# Patient Record
Sex: Female | Born: 1995 | Race: Black or African American | Hispanic: No | Marital: Single | Smoking: Current every day smoker
Health system: Southern US, Community
[De-identification: ages and names within clinical notes are randomized; demographics above are authoritative.]

## PROBLEM LIST (undated history)

## (undated) ENCOUNTER — Inpatient Hospital Stay (HOSPITAL_COMMUNITY): Payer: Self-pay

## (undated) DIAGNOSIS — W3400XA Accidental discharge from unspecified firearms or gun, initial encounter: Secondary | ICD-10-CM

## (undated) DIAGNOSIS — R42 Dizziness and giddiness: Secondary | ICD-10-CM

## (undated) DIAGNOSIS — J45909 Unspecified asthma, uncomplicated: Secondary | ICD-10-CM

## (undated) DIAGNOSIS — Y249XXA Unspecified firearm discharge, undetermined intent, initial encounter: Secondary | ICD-10-CM

## (undated) DIAGNOSIS — E669 Obesity, unspecified: Secondary | ICD-10-CM

## (undated) HISTORY — PX: TYMPANOSTOMY TUBE PLACEMENT: SHX32

---
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
## 2016-04-08 IMAGING — US US MFM FETAL NUCHAL TRANSLUCENCY
1 series · 13 of 23 positions shown · non-contrast
Comparison: none

[Series 1: us mfm fetal nuchal translucency · 0.26mm/px · 13 of 23 slices shown]
[im 1/23]
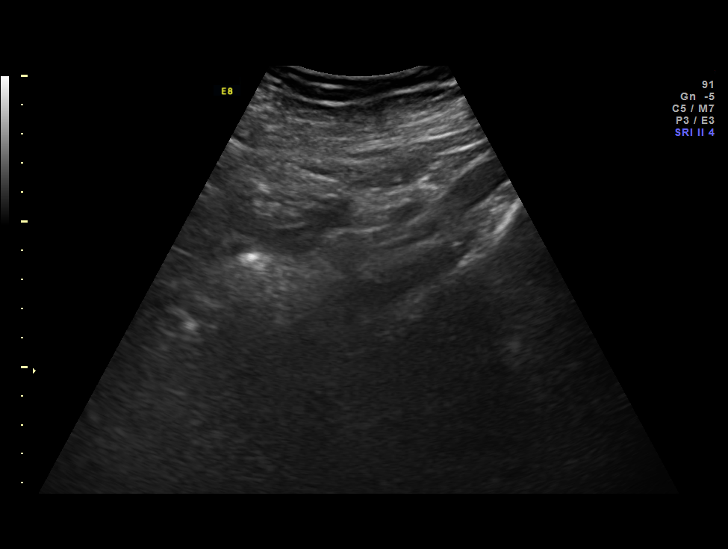
[im 3/23]
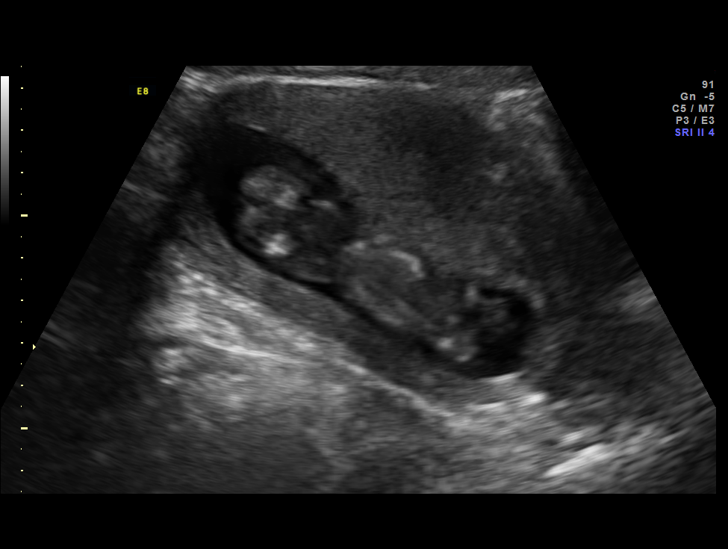
[im 5/23]
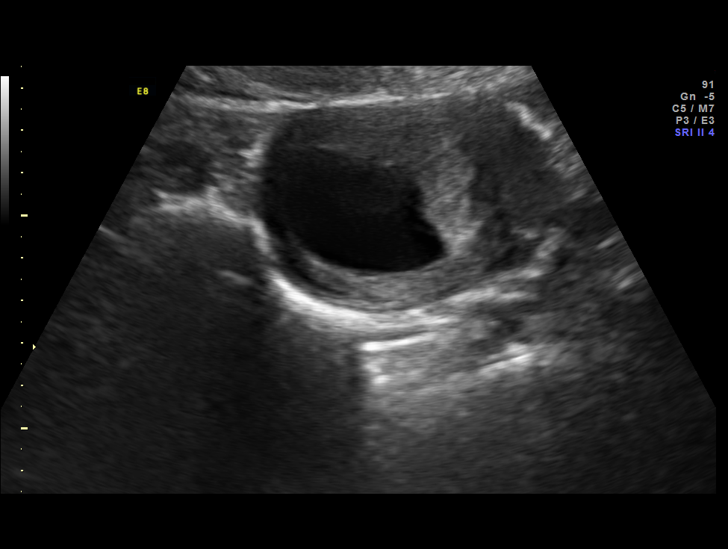
[im 7/23]
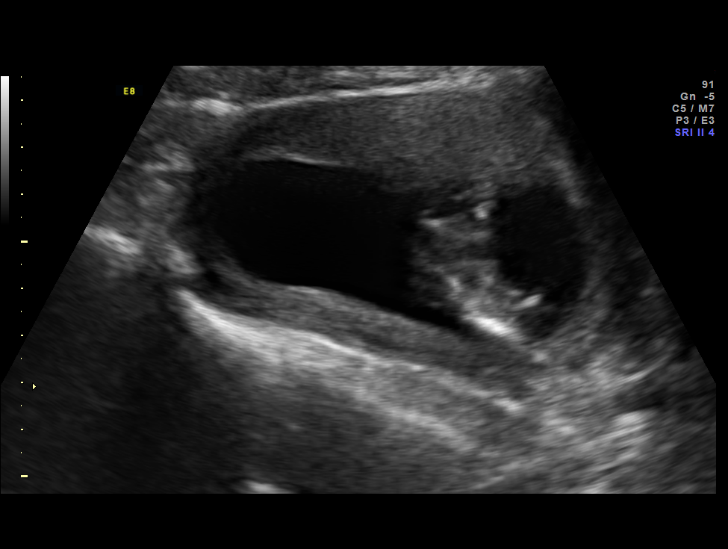
[im 8/23]
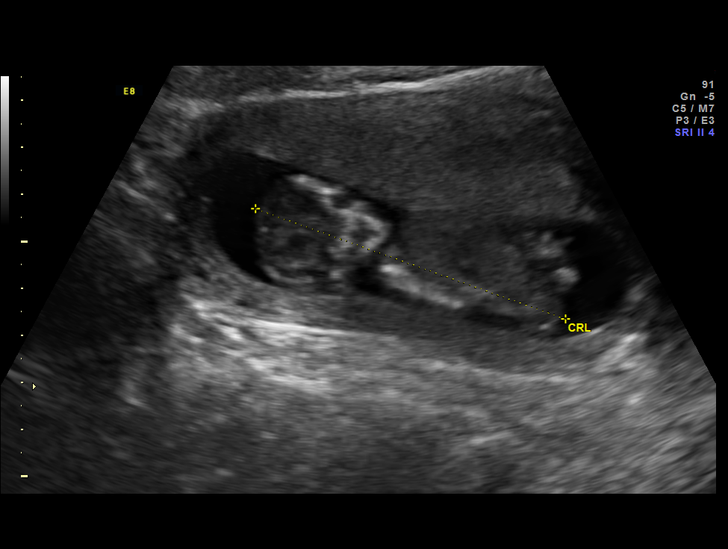
[im 10/23]
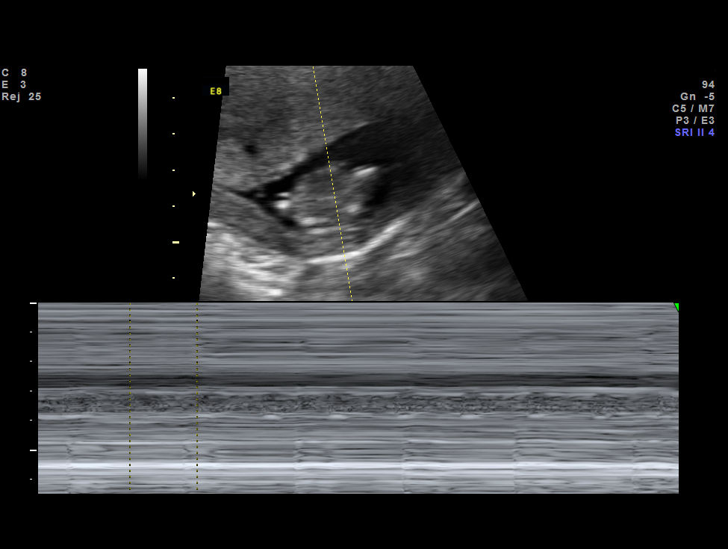
[im 12/23]
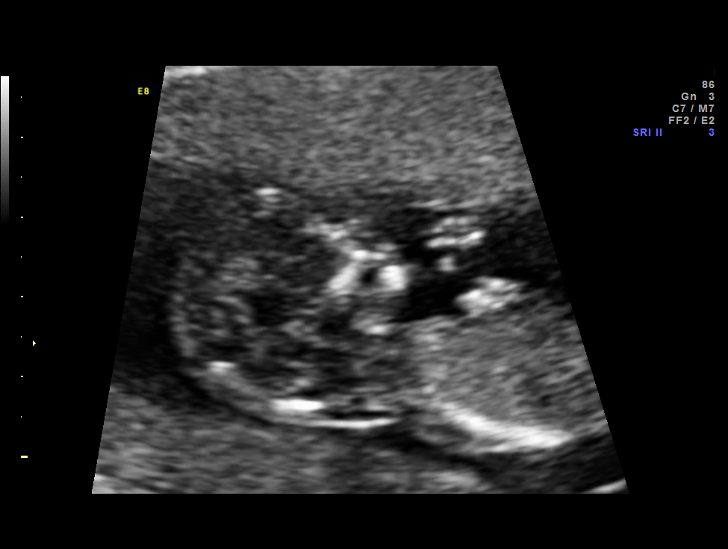
[im 14/23]
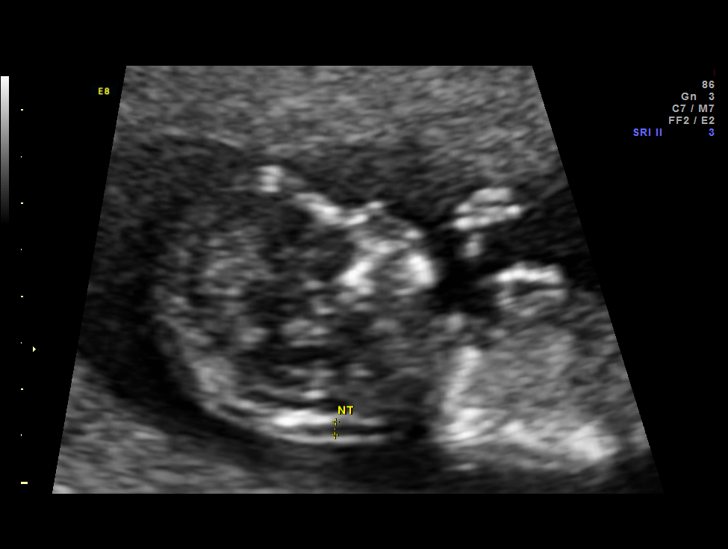
[im 16/23]
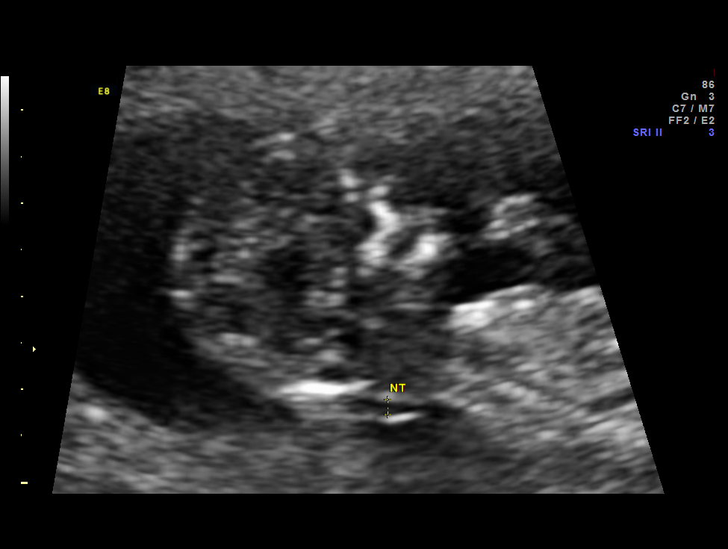
[im 17/23]
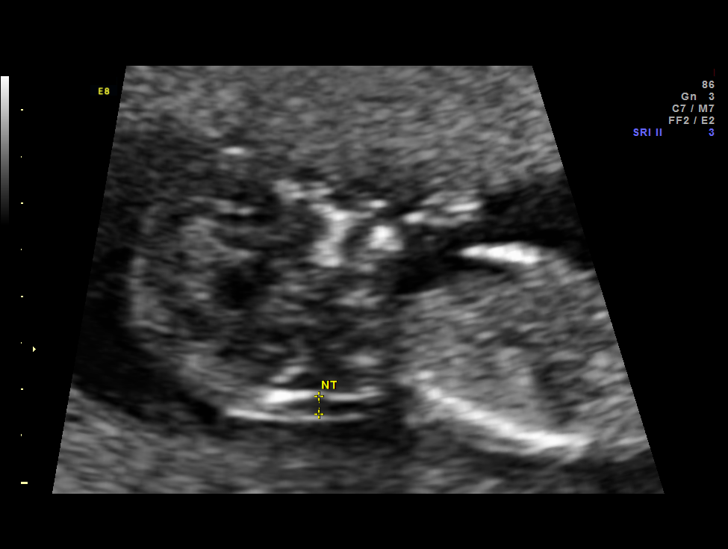
[im 19/23]
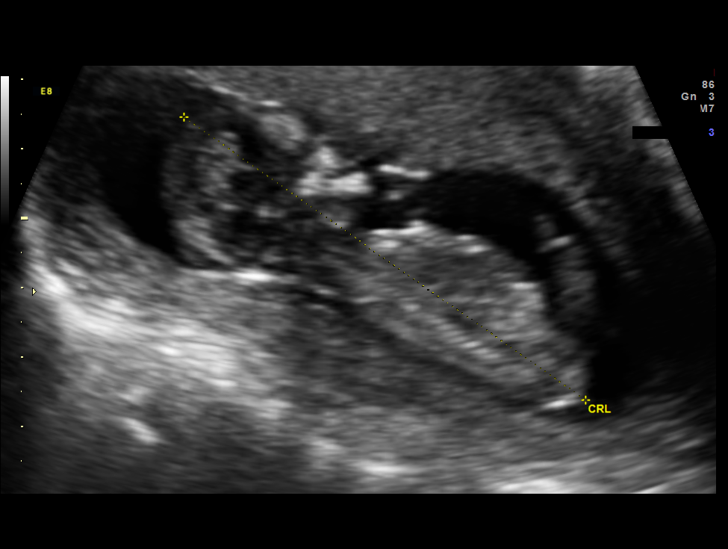
[im 21/23]
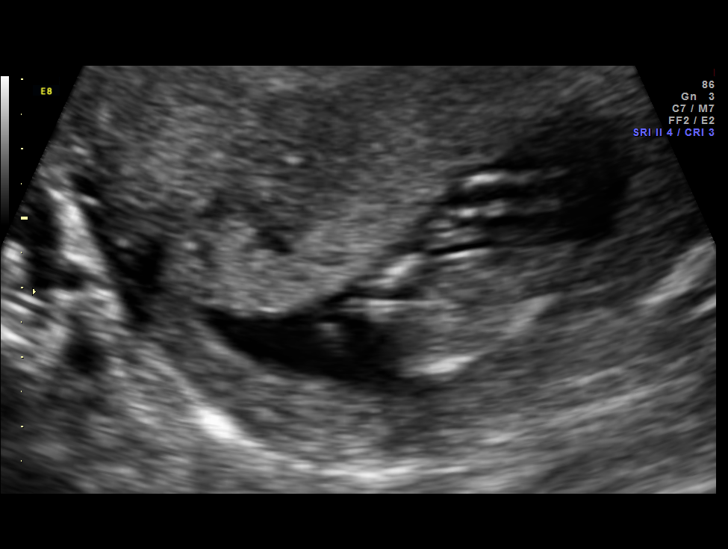
[im 23/23]
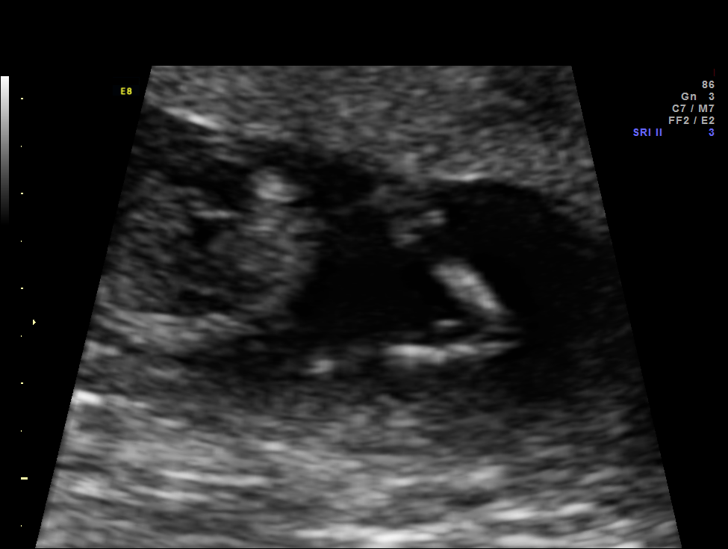

[13 of 23 positions shown; findings below may reference images not displayed]

OBSTETRICS REPORT
                      (Signed Final 04/28/2014 [DATE])

Service(s) Provided

Indications

 First trimester aneuploidy screen (NT)                Z36
 13 weeks gestation of pregnancy
Fetal Evaluation

 Num Of Fetuses:    1
 Fetal Heart Rate:  150                          bpm
 Cardiac Activity:  Observed
 Presentation:      Breech
 Placenta:          Anterior, above cervical os

 Amniotic Fluid
 AFI FV:      Subjectively within normal limits
Gestational Age

 LMP:           13w 0d        Date:  01/27/14                 EDD:   11/03/14
 Best:          13w 0d     Det. By:  LMP  (01/27/14)          EDD:   11/03/14
1st Trimester Genetic Sonogram Screening

 CRL:            70.8  mm    G. Age:   13w 1d                 EDD:   11/02/14
 Nuc Trans:       2.1  mm
 Nasal Bone:                 Present
Cervix Uterus Adnexa

 Cervix:       Normal appearance by transabdominal scan.
 Left Ovary:    Not visualized. No adnexal mass visualized.
 Right Ovary:   Not visualized. No adnexal mass visualized.
Impression

 Single IUP at 13w 0d
 NT 2.1 mm.  Nasal bone present.
 First trimester aneuploidy screen performed as noted above.
Recommendations

 Please do not draw triple/quad screen, though patient should
 be offered MSAFP for neural tube defect screening.
 Recommend ultrasound for fetal anatomy at 18-20 weeks
 gestation

 questions or concerns.

## 2018-09-24 ENCOUNTER — Other Ambulatory Visit: Payer: Self-pay

## 2018-09-24 ENCOUNTER — Encounter (HOSPITAL_COMMUNITY): Payer: Self-pay | Admitting: Emergency Medicine

## 2018-09-24 ENCOUNTER — Ambulatory Visit (HOSPITAL_COMMUNITY)
Admission: EM | Admit: 2018-09-24 | Discharge: 2018-09-24 | Disposition: A | Discharge status: Home / Self Care | Payer: Medicaid Other | Attending: Emergency Medicine | Admitting: Emergency Medicine

## 2018-09-24 DIAGNOSIS — Z113 Encounter for screening for infections with a predominantly sexual mode of transmission: Secondary | ICD-10-CM | POA: Diagnosis not present

## 2018-09-24 DIAGNOSIS — Z7251 High risk heterosexual behavior: Secondary | ICD-10-CM | POA: Diagnosis present

## 2018-09-24 DIAGNOSIS — F418 Other specified anxiety disorders: Secondary | ICD-10-CM

## 2018-09-24 DIAGNOSIS — Z3202 Encounter for pregnancy test, result negative: Secondary | ICD-10-CM | POA: Diagnosis not present

## 2018-09-24 LAB — POCT PREGNANCY, URINE: Preg Test, Ur: NEGATIVE

## 2018-09-24 NOTE — Discharge Instructions (Addendum)
We will call you with the results of your testing if positive, and treat appropriately. Return if you have known exposure, develop abdominal/pelvic/vaginal pain, vaginal discharge, anogenital lesions or fever.

## 2018-09-24 NOTE — ED Triage Notes (Signed)
Pt states "I used a rag that was used by someone else to clean my whole body and I just want to be checked to make sure I dont have anything". Pt requesting std check. Denies symptoms.

## 2018-09-24 NOTE — ED Provider Notes (Signed)
[facility]    CSN: [id number] Arrival date & time: 09/24/18  1202     History   Chief Complaint Chief Complaint  Patient presents with  . SEXUALLY TRANSMITTED DISEASE    HPI Carol Gonzales is a 23 y.o. female presenting for acute concern of STD check.  Patient states that she used a rag of a female friend while away the other weekend.  States that she heard rumors that this person has HSV.  Patient denies knowing if friend had active flare.  Patient denies vaginal irritation, pain, anogenital lesions.  Patient currently sexually active with both men and women, not routinely using condoms.  Patient has a Nexplanon in place, though is overdue for removal: Was placed August 2016.  Patient does not use backup methods.  LMP: last week.  Patient denies urinary frequency, urgency, hematuria, vaginal discharge, pelvic or vaginal pain, abdominal pain. Patient denies history of STD.  Past Medical History:  Diagnosis Date  . Asthma   . Obesity     Patient Active Problem List   Diagnosis Date Noted  . PROM (premature rupture of membranes) 11/03/2014  . Braxton Hick's contraction 09/19/2014  . Vaginal discharge during pregnancy in third trimester 09/19/2014    Past Surgical History:  Procedure Laterality Date  . TYMPANOSTOMY TUBE PLACEMENT      OB History    Gravida  1   Para  1   Term  1   Preterm      AB      Living  1     SAB      TAB      Ectopic      Multiple  0   Live Births  1            Home Medications    Prior to Admission medications   Medication Sig Start Date End Date Taking? Authorizing Provider  albuterol (PROVENTIL HFA;VENTOLIN HFA) 108 (90 BASE) MCG/ACT inhaler Inhale 2 puffs into the lungs every 6 (six) hours as needed for wheezing or shortness of breath (asthma).     [provider]  calcium carbonate (TUMS - DOSED IN MG ELEMENTAL CALCIUM) 500 MG chewable tablet Chew 1 tablet by mouth daily as needed for indigestion  or heartburn.    [provider]  docusate sodium (COLACE) 100 MG capsule Take 1 capsule (100 mg total) by mouth 2 (two) times daily as needed for mild constipation. 11/06/14   Katheren Shams, DO  ibuprofen (ADVIL,MOTRIN) 600 MG tablet Take 1 tablet (600 mg total) by mouth every 6 (six) hours. 11/06/14   Katheren Shams, DO  Prenatal Vit-Fe Fumarate-FA (PRENATAL MULTIVITAMIN) TABS tablet Take 1 tablet by mouth daily at 12 noon. Patient not taking: Reported on 09/24/2018 11/06/14   Katheren Shams, DO    Family History Family History  Problem Relation Age of Onset  . Hypertension Maternal Grandmother     Social History Social History   Tobacco Use  . Smoking status: Never Smoker  Substance Use Topics  . Alcohol use: No  . Drug use: No     Allergies   Patient has no known allergies.   Review of Systems As per HPI   Physical Exam Triage Vital Signs ED Triage Vitals [09/24/18 1232]  Enc Vitals Group     BP 122/68     Pulse Rate 96     Resp 16     Temp 98.8 F (37.1 C)  Temp src      SpO2 97 %     Weight      Height      Head Circumference      Peak Flow      Pain Score 0     Pain Loc      Pain Edu?      Excl. in GC?    No data found.  Updated Vital Signs BP 122/68   Pulse 96   Temp 98.8 F (37.1 C)   Resp 16   LMP 09/20/2018   SpO2 97%   Visual Acuity Right Eye Distance:   Left Eye Distance:   Bilateral Distance:    Right Eye Near:   Left Eye Near:    Bilateral Near:     Physical Exam Constitutional:      General: She is not in acute distress.    Appearance: She is obese.  HENT:     Head: Normocephalic and atraumatic.  Eyes:     General: No scleral icterus.    Pupils: Pupils are equal, round, and reactive to light.  Cardiovascular:     Rate and Rhythm: Normal rate.  Pulmonary:     Effort: Pulmonary effort is normal.  Abdominal:     General: Bowel sounds are normal.     Palpations: Abdomen is soft.     Tenderness: There is no  abdominal tenderness. There is no right CVA tenderness, left CVA tenderness or guarding.  Genitourinary:    Comments: Patient declined, self-swab performed Skin:    Coloration: Skin is not jaundiced or pale.     Comments: Nexplanon implant palpated in left bicipital.  Neurological:     Mental Status: She is alert and oriented to person, place, and time.      UC Treatments / Results  Labs (all labs ordered are listed, but only abnormal results are displayed) Labs Reviewed  HIV ANTIBODY (ROUTINE TESTING W REFLEX)  RPR  POC URINE PREG, ED  POCT PREGNANCY, URINE  CERVICOVAGINAL ANCILLARY ONLY    EKG   Radiology No results found.  Procedures Procedures (including critical care time)  Medications Ordered in UC Medications - No data to display  Initial Impression / Assessment and Plan / UC Course  I have reviewed the triage vital signs and the nursing notes.  Pertinent labs & imaging results that were available during my care of the patient were reviewed by me and considered in my medical decision making (see chart for details).     23 year old female presenting for STD check.  No previous infection, patient is asymptomatic, though does have risk due to multiple partners, unprotected intercourse, no contraception.  Urine pregnancy negative.  Patient follow-up with PCP for routine care and contraception management.  We will contact patient if STD comes back positive and treat appropriately.  Return precautions discussed, patient verbalized understanding and is agreeable to plan. Final Clinical Impressions(s) / UC Diagnoses   Final diagnoses:  Unprotected sex  Anxiety about health     Discharge Instructions     We will call you with the results of your testing if positive, and treat appropriately. Return if you have known exposure, develop abdominal/pelvic/vaginal pain, vaginal discharge, anogenital lesions or fever.    ED Prescriptions    None     Controlled  Substance Prescriptions [place] Controlled Substance Registry consulted? Not Applicable   Shea EvansHall-Potvin, Brittany, [place] JerseyPA-C 09/24/18 1311

## 2018-09-25 ENCOUNTER — Encounter (HOSPITAL_COMMUNITY): Payer: Self-pay

## 2018-09-25 LAB — HIV ANTIBODY (ROUTINE TESTING W REFLEX): HIV Screen 4th Generation wRfx: NONREACTIVE

## 2018-09-25 LAB — RPR: RPR Ser Ql: NONREACTIVE

## 2018-09-25 LAB — CERVICOVAGINAL ANCILLARY ONLY
Chlamydia: POSITIVE — AB
Neisseria Gonorrhea: POSITIVE — AB
Trichomonas: POSITIVE — AB

## 2018-09-26 ENCOUNTER — Ambulatory Visit (HOSPITAL_COMMUNITY)
Admission: EM | Admit: 2018-09-26 | Discharge: 2018-09-26 | Discharge status: Against Medical Advice | Payer: Medicaid Other

## 2018-09-26 ENCOUNTER — Encounter (HOSPITAL_COMMUNITY): Payer: Self-pay

## 2018-09-26 ENCOUNTER — Emergency Department (HOSPITAL_COMMUNITY)
Admission: EM | Admit: 2018-09-26 | Discharge: 2018-09-26 | Disposition: A | Discharge status: Home / Self Care | Payer: Medicaid Other | Attending: Emergency Medicine | Admitting: Emergency Medicine

## 2018-09-26 ENCOUNTER — Other Ambulatory Visit: Payer: Self-pay

## 2018-09-26 DIAGNOSIS — R799 Abnormal finding of blood chemistry, unspecified: Secondary | ICD-10-CM | POA: Diagnosis present

## 2018-09-26 DIAGNOSIS — A749 Chlamydial infection, unspecified: Secondary | ICD-10-CM | POA: Insufficient documentation

## 2018-09-26 DIAGNOSIS — Z79899 Other long term (current) drug therapy: Secondary | ICD-10-CM | POA: Insufficient documentation

## 2018-09-26 DIAGNOSIS — J45909 Unspecified asthma, uncomplicated: Secondary | ICD-10-CM | POA: Diagnosis not present

## 2018-09-26 DIAGNOSIS — A599 Trichomoniasis, unspecified: Secondary | ICD-10-CM

## 2018-09-26 DIAGNOSIS — A549 Gonococcal infection, unspecified: Secondary | ICD-10-CM | POA: Diagnosis not present

## 2018-09-26 MED ORDER — AZITHROMYCIN 250 MG PO TABS
1000.0000 mg | ORAL_TABLET | Freq: Once | ORAL | Status: AC
  Administered 2018-09-26: 1000 mg via ORAL
  Filled 2018-09-26: qty 4

## 2018-09-26 MED ORDER — CEFTRIAXONE SODIUM 250 MG IJ SOLR
250.0000 mg | Freq: Once | INTRAMUSCULAR | Status: AC
Start: 2018-09-26 — End: 2018-09-26
  Administered 2018-09-26: 250 mg via INTRAMUSCULAR
  Filled 2018-09-26: qty 250

## 2018-09-26 MED ORDER — LIDOCAINE HCL (PF) 1 % IJ SOLN
INTRAMUSCULAR | Status: AC
  Administered 2018-09-26: 0.9 mL
  Filled 2018-09-26: qty 5

## 2018-09-26 MED ORDER — METRONIDAZOLE 500 MG PO TABS
2000.0000 mg | ORAL_TABLET | Freq: Once | ORAL | Status: AC
  Administered 2018-09-26: 2000 mg via ORAL
  Filled 2018-09-26: qty 4

## 2018-09-26 NOTE — ED Notes (Signed)
Carol Gonzales, patient access reported patient leaving due to wait times

## 2018-09-26 NOTE — ED Provider Notes (Signed)
[facility] EMERGENCY DEPARTMENT Provider Note   CSN: [id number] Arrival date & time: 09/26/18  1513    History   Chief Complaint Chief Complaint  Patient presents with  . needs med for STD    HPI Carol Gonzales is a 23 y.o. female presents today for evaluation of abnormal lab results.  She reports 2 days ago she went to urgent care for STD screening evaluation.  She has had no abdominal pain, nausea, vomiting, urinary symptoms, vaginal itching, bleeding, or discharge.  She is currently sexually active with both men and women and does not use contraceptives or condoms regularly.  She checked my chart and noted that she had abnormal labs from the cultures that were obtained at that time so she came here for treatment.  She is also requesting for [place] to remove her Nexplanon which will be 2 years overdue for removal in August.     The history is provided by the patient.    Past Medical History:  Diagnosis Date  . Asthma   . Obesity     Patient Active Problem List   Diagnosis Date Noted  . PROM (premature rupture of membranes) 11/03/2014  . Braxton Hick's contraction 09/19/2014  . Vaginal discharge during pregnancy in third trimester 09/19/2014    Past Surgical History:  Procedure Laterality Date  . TYMPANOSTOMY TUBE PLACEMENT       OB History    Gravida  1   Para  1   Term  1   Preterm      AB      Living  1     SAB      TAB      Ectopic      Multiple  0   Live Births  1            Home Medications    Prior to Admission medications   Medication Sig Start Date End Date Taking? Authorizing Provider  albuterol (PROVENTIL HFA;VENTOLIN HFA) 108 (90 BASE) MCG/ACT inhaler Inhale 2 puffs into the lungs every 6 (six) hours as needed for wheezing or shortness of breath (asthma).     [provider]  calcium carbonate (TUMS - DOSED IN MG ELEMENTAL CALCIUM) 500 MG chewable tablet Chew 1 tablet by mouth daily as needed for  indigestion or heartburn.    [provider]  docusate sodium (COLACE) 100 MG capsule Take 1 capsule (100 mg total) by mouth 2 (two) times daily as needed for mild constipation. 11/06/14   Katheren Shams, DO  ibuprofen (ADVIL,MOTRIN) 600 MG tablet Take 1 tablet (600 mg total) by mouth every 6 (six) hours. 11/06/14   Katheren Shams, DO  Prenatal Vit-Fe Fumarate-FA (PRENATAL MULTIVITAMIN) TABS tablet Take 1 tablet by mouth daily at 12 noon. Patient not taking: Reported on 09/24/2018 11/06/14   Katheren Shams, DO    Family History Family History  Problem Relation Age of Onset  . Hypertension Maternal Grandmother     Social History Social History   Tobacco Use  . Smoking status: Never Smoker  Substance Use Topics  . Alcohol use: No  . Drug use: No     Allergies   Patient has no known allergies.   Review of Systems Review of Systems  Constitutional: Negative for chills and fever.  Gastrointestinal: Negative for abdominal pain, nausea and vomiting.  Genitourinary: Negative for dysuria, frequency, genital sores, hematuria, urgency, vaginal bleeding, vaginal discharge and vaginal pain.  All other  systems reviewed and are negative.    Physical Exam Updated Vital Signs BP 128/64 (BP Location: Right Arm)   Pulse 95   Temp 98.4 F (36.9 C) (Oral)   Resp 16   LMP 09/20/2018   SpO2 99%   Physical Exam Vitals signs and nursing note reviewed.  Constitutional:      General: She is not in acute distress.    Appearance: She is well-developed.     Comments: Resting comfortably in chair  HENT:     Head: Normocephalic and atraumatic.  Eyes:     General:        Right eye: No discharge.        Left eye: No discharge.     Conjunctiva/sclera: Conjunctivae normal.  Neck:     Vascular: No JVD.     Trachea: No tracheal deviation.  Cardiovascular:     Rate and Rhythm: Normal rate.  Pulmonary:     Effort: Pulmonary effort is normal.  Abdominal:     General: Abdomen is  flat. Bowel sounds are normal. There is no distension.     Palpations: Abdomen is soft.     Tenderness: There is no abdominal tenderness. There is no right CVA tenderness, left CVA tenderness, guarding or rebound.  Skin:    Findings: No erythema.  Neurological:     Mental Status: She is alert.  Psychiatric:        Behavior: Behavior normal.      ED Treatments / Results  Labs (all labs ordered are listed, but only abnormal results are displayed) Labs Reviewed - No data to display  EKG None  Radiology No results found.  Procedures Procedures (including critical care time)  Medications Ordered in ED Medications  cefTRIAXone (ROCEPHIN) injection 250 mg (has no administration in time range)  azithromycin (ZITHROMAX) tablet 1,000 mg (has no administration in time range)  metroNIDAZOLE (FLAGYL) tablet 2,000 mg (has no administration in time range)     Initial Impression / Assessment and Plan / ED Course  I have reviewed the triage vital signs and the nursing notes.  Pertinent labs & imaging results that were available during my care of the patient were reviewed by me and considered in my medical decision making (see chart for details).        Patient presents for evaluation of abnormal STD cultures obtained 2 days ago.  Chart review shows that she is positive for gonorrhea, chlamydia, and trichomonas.  She is afebrile, vital signs are stable.  She is entirely asymptomatic and her physical examination is reassuring.  No concern for PID, TOA, ovarian torsion, or ectopic pregnancy.  She was given Rocephin, Zithromax, and Flagyl in the ED for treatment.  We discussed safe sex practices.  I will give her follow-up with the health department and an OB/GYN for reevaluation, further STD testing, and for follow-up of her Nexplanon which is overdue for removal.  I explained that we do not remove these in the ED.  Discussed strict ED return precautions. Patient verbalized understanding of  and agreement with plan and is safe for discharge home at this time.   Final Clinical Impressions(s) / ED Diagnoses   Final diagnoses:  Gonorrhea  Chlamydia  Trichomonal infection    ED Discharge Orders    None       Jeanie SewerFawze, Katreena Schupp A, PA-C 09/26/18 1647    Charlynne PanderYao, David Hsienta, MD 09/26/18 2321

## 2018-09-26 NOTE — ED Triage Notes (Signed)
Patient states that she was notified and needs medication for STD

## 2018-09-26 NOTE — Discharge Instructions (Signed)
Follow up with [facility] Department STD clinic to be screened for HIV in the future and for future STD concerns or screenings.  They may also be able to remove your Nexplanon.  This is the recommendation by the CDC for people with multiple sexual partners or hx of STDs. You have been treated for gonorrhea and chlamydia and trichomoniasis in the ER.  Do not have sexual intercourse for 7 days after antibiotic treatment.  I have also given you information for the local women's clinic and other resources for follow-up for OB/GYN services.  I would recommend that you establish care with an OB/GYN for any further gynecological concerns.

## 2018-11-18 ENCOUNTER — Other Ambulatory Visit: Payer: Self-pay

## 2018-11-18 ENCOUNTER — Encounter (HOSPITAL_COMMUNITY): Payer: Self-pay

## 2018-11-18 ENCOUNTER — Emergency Department (HOSPITAL_COMMUNITY)
Admission: EM | Admit: 2018-11-18 | Discharge: 2018-11-18 | Discharge status: Against Medical Advice | Payer: Medicaid Other

## 2018-11-18 NOTE — ED Triage Notes (Signed)
To triage via EMS.  Pt was pulled over by [employer], pt started c/o shortness of breath and right subclavicular pain.  Lungs clear.  NAD noted at triage.

## 2019-03-11 ENCOUNTER — Encounter (HOSPITAL_COMMUNITY): Payer: Self-pay | Admitting: Emergency Medicine

## 2019-03-11 ENCOUNTER — Other Ambulatory Visit: Payer: Self-pay

## 2019-03-11 ENCOUNTER — Ambulatory Visit (HOSPITAL_COMMUNITY)
Admission: EM | Admit: 2019-03-11 | Discharge: 2019-03-11 | Disposition: A | Discharge status: Home / Self Care | Payer: Medicaid Other | Attending: Urgent Care | Admitting: Urgent Care

## 2019-03-11 DIAGNOSIS — K047 Periapical abscess without sinus: Secondary | ICD-10-CM | POA: Diagnosis not present

## 2019-03-11 DIAGNOSIS — K0889 Other specified disorders of teeth and supporting structures: Secondary | ICD-10-CM

## 2019-03-11 MED ORDER — AMOXICILLIN-POT CLAVULANATE 875-125 MG PO TABS: 1.0000 | ORAL_TABLET | Freq: Two times a day (BID) | ORAL | 0 refills | Status: DC

## 2019-03-11 MED ORDER — NAPROXEN 500 MG PO TABS: 500.0000 mg | ORAL_TABLET | Freq: Two times a day (BID) | ORAL | 0 refills | Status: DC

## 2019-03-11 NOTE — ED Triage Notes (Signed)
Here for dental pain onset last night associated w/swelling  Denies fevers  A&O x4... NAD.Marland Kitchen. ambulatory

## 2019-03-11 NOTE — ED Provider Notes (Signed)
[facility]   MRN: [id number] DOB: Aug 31, 1995  Subjective:   Carol Gonzales is a 23 y.o. female presenting for 1 day history of acute onset worsening moderate to severe right facial pain with swelling and upper dental pain.  Patient has significant history of dental issues, has had molars removed and caps placed.  She tried to contact her dental practice but will be unable to see them until later in January.  Denies fever, runny or stuffy nose, sore throat, cough, chest pain, shortness of breath, nausea, vomiting, belly pain.  No current facility-administered medications for this encounter.  Current Outpatient Medications:  .  albuterol (PROVENTIL HFA;VENTOLIN HFA) 108 (90 BASE) MCG/ACT inhaler, Inhale 2 puffs into the lungs every 6 (six) hours as needed for wheezing or shortness of breath (asthma). , Disp: , Rfl:  .  calcium carbonate (TUMS - DOSED IN MG ELEMENTAL CALCIUM) 500 MG chewable tablet, Chew 1 tablet by mouth daily as needed for indigestion or heartburn., Disp: , Rfl:  .  docusate sodium (COLACE) 100 MG capsule, Take 1 capsule (100 mg total) by mouth 2 (two) times daily as needed for mild constipation., Disp: 30 capsule, Rfl: 0 .  ibuprofen (ADVIL,MOTRIN) 600 MG tablet, Take 1 tablet (600 mg total) by mouth every 6 (six) hours., Disp: 30 tablet, Rfl: 0 .  Prenatal Vit-Fe Fumarate-FA (PRENATAL MULTIVITAMIN) TABS tablet, Take 1 tablet by mouth daily at 12 noon. (Patient not taking: Reported on 09/24/2018), Disp: 30 tablet, Rfl: 0   No Known Allergies  Past Medical History:  Diagnosis Date  . Asthma   . Obesity      Past Surgical History:  Procedure Laterality Date  . TYMPANOSTOMY TUBE PLACEMENT      Family History  Problem Relation Age of Onset  . Hypertension Maternal Grandmother     Social History   Tobacco Use  . Smoking status: Never Smoker  Substance Use Topics  . Alcohol use: No  . Drug use: No    ROS   Objective:   Vitals: BP 137/62 (BP  Location: Right Arm)   Pulse 70   Temp 98.1 F (36.7 C) (Oral)   Resp 16   LMP 03/07/2019 (LMP Unknown)   SpO2 100%   Breastfeeding No   Physical Exam Constitutional:      General: She is not in acute distress.    Appearance: Normal appearance. She is well-developed. She is not ill-appearing, toxic-appearing or diaphoretic.  HENT:     Head: Normocephalic and atraumatic.     Nose: Nose normal.     Mouth/Throat:     Mouth: Mucous membranes are moist.     Dentition: Abnormal dentition. Dental tenderness, gingival swelling and dental caries present.     Pharynx: Oropharynx is clear.   Eyes:     General: No scleral icterus.    Extraocular Movements: Extraocular movements intact.     Pupils: Pupils are equal, round, and reactive to light.  Cardiovascular:     Rate and Rhythm: Normal rate.  Pulmonary:     Effort: Pulmonary effort is normal.  Skin:    General: Skin is warm and dry.  Neurological:     General: No focal deficit present.     Mental Status: She is alert and oriented to person, place, and time.  Psychiatric:        Mood and Affect: Mood normal.        Behavior: Behavior normal.      Assessment and Plan :  1. Dental infection   2. Pain, dental     Start Augmentin for dental infection, use naproxen for pain and inflammation. Emphasized need for dental surgeon consult. Counseled patient on potential for adverse effects with medications prescribed/recommended today, strict ER and return-to-clinic precautions discussed, patient verbalized understanding.   Jaynee Eagles, PA-C 03/11/19 1341

## 2019-03-11 NOTE — Discharge Instructions (Addendum)
[facility] [phone] extension [phone]  Dr. Civils [phone] [address]  [facility] [phone] [address]  [facility] [phone] extension [phone] [place], [place], [address] First come first serve for the first 10 clients.  May do simple extractions only, no wisdom teeth or surgery.  You may try the second for Thursday of the month starting at 6:30 AM.  [employer] You may call the school to see if they are still helping to provide dental care for emergent cases.  

## 2019-05-08 ENCOUNTER — Other Ambulatory Visit: Payer: Self-pay

## 2019-05-08 ENCOUNTER — Ambulatory Visit (HOSPITAL_COMMUNITY)
Admission: EM | Admit: 2019-05-08 | Discharge: 2019-05-08 | Disposition: A | Discharge status: Home / Self Care | Payer: Medicaid Other | Attending: Emergency Medicine | Admitting: Emergency Medicine

## 2019-05-08 ENCOUNTER — Encounter (HOSPITAL_COMMUNITY): Payer: Self-pay

## 2019-05-08 DIAGNOSIS — N6452 Nipple discharge: Secondary | ICD-10-CM

## 2019-05-08 DIAGNOSIS — Z3202 Encounter for pregnancy test, result negative: Secondary | ICD-10-CM

## 2019-05-08 LAB — POC URINE PREG, ED: Preg Test, Ur: NEGATIVE

## 2019-05-08 LAB — POCT PREGNANCY, URINE: Preg Test, Ur: NEGATIVE

## 2019-05-08 NOTE — ED Triage Notes (Signed)
Pt states left breast leaking a "pearlish" discharge which awakened her during the night b/c of wetness. Last childbirth 4 years ago. Recently d/c nexplanon on 02/09/19.  States LMP 02/10 but shorter than usual (3 days duration). Denies abd pain but states she feels like she is having "phantom kicks" as if pregnant.

## 2019-05-08 NOTE — ED Provider Notes (Signed)
[facility]    CSN: [id number] Arrival date & time: 05/08/19  1024      History   Chief Complaint Chief Complaint  Patient presents with  . breast discharge    HPI Carol Gonzales is a 24 y.o. female.   Carol Gonzales presents with complaints of discharge from left nippled which she first noted on 2/16. She had woken up and felt leaking, similar to when she has breastfed in the past. Her child is 19 years old, however. Has intermittently noted moisture to her bra since with white, non purulent, non odorous discharge. No breast pain, redness, swelling or obvious masses. LMP was 2/10-2/13, which was expected time frame from last period. She had a nexplanon removed 02/09/2019 after having it in place for years, it was a year overdue for changing at time of removal. She is not on any birth control currently. She is sexually active, uses condoms, although states there was once when she did not use a condom. No vaginal bleeding. No pelvic pain. No known family history of breast cancer.    ROS per HPI, negative if not otherwise mentioned.      Past Medical History:  Diagnosis Date  . Asthma   . Obesity     Patient Active Problem List   Diagnosis Date Noted  . PROM (premature rupture of membranes) 11/03/2014  . Braxton Hick's contraction 09/19/2014  . Vaginal discharge during pregnancy in third trimester 09/19/2014    Past Surgical History:  Procedure Laterality Date  . TYMPANOSTOMY TUBE PLACEMENT      OB History    Gravida  1   Para  1   Term  1   Preterm      AB      Living  1     SAB      TAB      Ectopic      Multiple  0   Live Births  1            Home Medications    Prior to Admission medications   Medication Sig Start Date End Date Taking? Authorizing Provider  ibuprofen (ADVIL,MOTRIN) 600 MG tablet Take 1 tablet (600 mg total) by mouth every 6 (six) hours. 11/06/14  Yes Luiz Blare Y, DO  naproxen (NAPROSYN) 500 MG tablet  Take 1 tablet (500 mg total) by mouth 2 (two) times daily. 03/11/19  Yes Jaynee Eagles, PA-C  albuterol (PROVENTIL HFA;VENTOLIN HFA) 108 (90 BASE) MCG/ACT inhaler Inhale 2 puffs into the lungs every 6 (six) hours as needed for wheezing or shortness of breath (asthma).     [provider]  amoxicillin-clavulanate (AUGMENTIN) 875-125 MG tablet Take 1 tablet by mouth every 12 (twelve) hours. 03/11/19   Jaynee Eagles, PA-C  calcium carbonate (TUMS - DOSED IN MG ELEMENTAL CALCIUM) 500 MG chewable tablet Chew 1 tablet by mouth daily as needed for indigestion or heartburn.    [provider]  docusate sodium (COLACE) 100 MG capsule Take 1 capsule (100 mg total) by mouth 2 (two) times daily as needed for mild constipation. 11/06/14   Katheren Shams, DO  Prenatal Vit-Fe Fumarate-FA (PRENATAL MULTIVITAMIN) TABS tablet Take 1 tablet by mouth daily at 12 noon. Patient not taking: Reported on 09/24/2018 11/06/14   Katheren Shams, DO    Family History Family History  Problem Relation Age of Onset  . Hypertension Maternal Grandmother     Social History Social History   Tobacco Use  .  Smoking status: Current Every Day Smoker    Packs/day: 0.50    Types: Cigarettes  . Smokeless tobacco: Never Used  Substance Use Topics  . Alcohol use: No  . Drug use: Yes    Types: Marijuana     Allergies   Patient has no known allergies.   Review of Systems Review of Systems   Physical Exam Triage Vital Signs ED Triage Vitals  Enc Vitals Group     BP 05/08/19 1052 134/71     Pulse Rate 05/08/19 1052 74     Resp 05/08/19 1052 16     Temp 05/08/19 1052 98.3 F (36.8 C)     Temp Source 05/08/19 1052 Oral     SpO2 05/08/19 1052 100 %     Weight --      Height --      Head Circumference --      Peak Flow --      Pain Score 05/08/19 1049 3     Pain Loc --      Pain Edu? --      Excl. in GC? --    No data found.  Updated Vital Signs BP 134/71 (BP Location: Left Arm)   Pulse 74    Temp 98.3 F (36.8 C) (Oral)   Resp 16   LMP 04/28/2019   SpO2 100%   Visual Acuity Right Eye Distance:   Left Eye Distance:   Bilateral Distance:    Right Eye Near:   Left Eye Near:    Bilateral Near:     Physical Exam Constitutional:      General: She is not in acute distress.    Appearance: She is well-developed.  Cardiovascular:     Rate and Rhythm: Normal rate.  Pulmonary:     Effort: Pulmonary effort is normal.  Chest:     Breasts:        Right: Normal.        Left: Normal. No bleeding, mass, nipple discharge, skin change or tenderness.     Comments: No discharge on exam today; small folliculitis noted to underside of breast; otherwise no palpable or visible abnormality  Lymphadenopathy:     Upper Body:     Left upper body: No axillary adenopathy.  Skin:    General: Skin is warm and dry.  Neurological:     Mental Status: She is alert and oriented to person, place, and time.      UC Treatments / Results  Labs (all labs ordered are listed, but only abnormal results are displayed) Labs Reviewed  POCT PREGNANCY, URINE  POC URINE PREG, ED    EKG   Radiology No results found.  Procedures Procedures (including critical care time)  Medications Ordered in UC Medications - No data to display  Initial Impression / Assessment and Plan / UC Course  I have reviewed the triage vital signs and the nursing notes.  Pertinent labs & imaging results that were available during my care of the patient were reviewed by me and considered in my medical decision making (see chart for details).     Nipple discharge without indication of abscess or pathological source. Suspect change in birth control recently may be contributing. Negative pregnancy here today. Encouraged follow up with gynecology if persistent and signs for return as may need imaging if worsening or progressing in any way. Patient verbalized understanding and agreeable to plan.    Final Clinical  Impressions(s) / UC Diagnoses   Final diagnoses:  Nipple discharge in female     Discharge Instructions     Your pregnancy test is negative today.  Exam is overall reassuring.  This may be associated with your recent change in birth control.  I would recommend following up with gynecology if symptoms persist.  Please use condoms if trying to prevent pregnancy.  Please seek further evaluation if develop pain to the breast, redness, swelling, palpable masses or firmness.     ED Prescriptions    None     PDMP not reviewed this encounter.   Georgetta Haber, NP 05/08/19 1152

## 2019-05-08 NOTE — Discharge Instructions (Signed)
Your pregnancy test is negative today.  Exam is overall reassuring.  This may be associated with your recent change in birth control.  I would recommend following up with gynecology if symptoms persist.  Please use condoms if trying to prevent pregnancy.  Please seek further evaluation if develop pain to the breast, redness, swelling, palpable masses or firmness.

## 2019-07-22 ENCOUNTER — Encounter (HOSPITAL_COMMUNITY): Payer: Self-pay | Admitting: *Deleted

## 2019-07-22 ENCOUNTER — Inpatient Hospital Stay (HOSPITAL_COMMUNITY): Payer: Medicaid Other | Admitting: Anesthesiology

## 2019-07-22 ENCOUNTER — Inpatient Hospital Stay (HOSPITAL_COMMUNITY)
Admission: EM | Admit: 2019-07-22 | Discharge: 2019-07-24 | DRG: 087 | Disposition: A | Discharge status: Home / Self Care | Payer: Medicaid Other | Attending: General Surgery | Admitting: General Surgery

## 2019-07-22 ENCOUNTER — Emergency Department (HOSPITAL_COMMUNITY): Payer: Medicaid Other

## 2019-07-22 ENCOUNTER — Other Ambulatory Visit: Payer: Self-pay

## 2019-07-22 ENCOUNTER — Encounter (HOSPITAL_COMMUNITY): Admission: EM | Disposition: A | Discharge status: Home / Self Care | Payer: Self-pay | Source: Home / Self Care

## 2019-07-22 DIAGNOSIS — S0291XB Unspecified fracture of skull, initial encounter for open fracture: Secondary | ICD-10-CM | POA: Diagnosis not present

## 2019-07-22 DIAGNOSIS — Z20822 Contact with and (suspected) exposure to covid-19: Secondary | ICD-10-CM | POA: Diagnosis present

## 2019-07-22 DIAGNOSIS — M795 Residual foreign body in soft tissue: Secondary | ICD-10-CM | POA: Diagnosis present

## 2019-07-22 DIAGNOSIS — W3400XA Accidental discharge from unspecified firearms or gun, initial encounter: Secondary | ICD-10-CM

## 2019-07-22 DIAGNOSIS — Y92410 Unspecified street and highway as the place of occurrence of the external cause: Secondary | ICD-10-CM | POA: Diagnosis not present

## 2019-07-22 DIAGNOSIS — F17200 Nicotine dependence, unspecified, uncomplicated: Secondary | ICD-10-CM | POA: Diagnosis not present

## 2019-07-22 DIAGNOSIS — S0193XA Puncture wound without foreign body of unspecified part of head, initial encounter: Secondary | ICD-10-CM

## 2019-07-22 HISTORY — PX: CRANIOTOMY: SHX93

## 2019-07-22 LAB — CBC
HCT: 39.9 % (ref 36.0–46.0)
Hemoglobin: 11.8 g/dL — ABNORMAL LOW (ref 12.0–15.0)
MCH: 23.6 pg — ABNORMAL LOW (ref 26.0–34.0)
MCHC: 29.6 g/dL — ABNORMAL LOW (ref 30.0–36.0)
MCV: 79.6 fL — ABNORMAL LOW (ref 80.0–100.0)
Platelets: 389 10*3/uL (ref 150–400)
RBC: 5.01 MIL/uL (ref 3.87–5.11)
RDW: 17.3 % — ABNORMAL HIGH (ref 11.5–15.5)
WBC: 10 10*3/uL (ref 4.0–10.5)
nRBC: 0 % (ref 0.0–0.2)

## 2019-07-22 LAB — PROTIME-INR
INR: 1.1 (ref 0.8–1.2)
Prothrombin Time: 13.4 seconds (ref 11.4–15.2)

## 2019-07-22 LAB — I-STAT CHEM 8, ED
BUN: 8 mg/dL (ref 6–20)
Calcium, Ion: 1.02 mmol/L — ABNORMAL LOW (ref 1.15–1.40)
Chloride: 109 mmol/L (ref 98–111)
Creatinine, Ser: 0.5 mg/dL (ref 0.44–1.00)
Glucose, Bld: 114 mg/dL — ABNORMAL HIGH (ref 70–99)
HCT: 40 % (ref 36.0–46.0)
Hemoglobin: 13.6 g/dL (ref 12.0–15.0)
Potassium: 3.5 mmol/L (ref 3.5–5.1)
Sodium: 139 mmol/L (ref 135–145)
TCO2: 19 mmol/L — ABNORMAL LOW (ref 22–32)

## 2019-07-22 LAB — COMPREHENSIVE METABOLIC PANEL
ALT: 17 U/L (ref 0–44)
AST: 22 U/L (ref 15–41)
Albumin: 4 g/dL (ref 3.5–5.0)
Alkaline Phosphatase: 59 U/L (ref 38–126)
Anion gap: 15 (ref 5–15)
BUN: 9 mg/dL (ref 6–20)
CO2: 15 mmol/L — ABNORMAL LOW (ref 22–32)
Calcium: 9.2 mg/dL (ref 8.9–10.3)
Chloride: 108 mmol/L (ref 98–111)
Creatinine, Ser: 0.77 mg/dL (ref 0.44–1.00)
GFR calc Af Amer: >60 mL/min (ref >60)
GFR calc non Af Amer: >60 mL/min (ref >60)
Glucose, Bld: 120 mg/dL — ABNORMAL HIGH (ref 70–99)
Potassium: 3.4 mmol/L — ABNORMAL LOW (ref 3.5–5.1)
Sodium: 138 mmol/L (ref 135–145)
Total Bilirubin: 0.4 mg/dL (ref 0.3–1.2)
Total Protein: 7.3 g/dL (ref 6.5–8.1)

## 2019-07-22 LAB — MRSA PCR SCREENING: MRSA by PCR: NEGATIVE

## 2019-07-22 LAB — RESPIRATORY PANEL BY RT PCR (FLU A&B, COVID)
Influenza A by PCR: NEGATIVE
Influenza B by PCR: NEGATIVE
SARS Coronavirus 2 by RT PCR: NEGATIVE

## 2019-07-22 LAB — LACTIC ACID, PLASMA: Lactic Acid, Venous: 4.4 mmol/L (ref 0.5–1.9)

## 2019-07-22 LAB — I-STAT BETA HCG BLOOD, ED (MC, WL, AP ONLY): I-stat hCG, quantitative: <5 m[IU]/mL (ref <5)

## 2019-07-22 LAB — ETHANOL: Alcohol, Ethyl (B): <10 mg/dL (ref <10)

## 2019-07-22 SURGERY — CRANIOTOMY HEMATOMA EVACUATION SUBDURAL
Anesthesia: General | Site: Head

## 2019-07-22 MED ORDER — ONDANSETRON HCL 4 MG/2ML IJ SOLN
4.0000 mg | Freq: Four times a day (QID) | INTRAMUSCULAR | Status: DC | PRN
  Administered 2019-07-22 – 2019-07-24 (×2): 4 mg via INTRAVENOUS
  Filled 2019-07-22 (×2): qty 2

## 2019-07-22 MED ORDER — PROPOFOL 10 MG/ML IV BOLUS
INTRAVENOUS | Status: AC
  Filled 2019-07-22: qty 20

## 2019-07-22 MED ORDER — DEXAMETHASONE SODIUM PHOSPHATE 10 MG/ML IJ SOLN
INTRAMUSCULAR | Status: DC | PRN
  Administered 2019-07-22: 4 mg via INTRAVENOUS

## 2019-07-22 MED ORDER — SODIUM CHLORIDE 0.9 % IV SOLN
2.0000 g | Freq: Once | INTRAVENOUS | Status: AC
  Administered 2019-07-22: 2 g via INTRAVENOUS
  Filled 2019-07-22: qty 20

## 2019-07-22 MED ORDER — HYDROCODONE-ACETAMINOPHEN 5-325 MG PO TABS
1.0000 | ORAL_TABLET | ORAL | Status: DC | PRN
  Administered 2019-07-22 – 2019-07-23 (×3): 1 via ORAL
  Filled 2019-07-22 (×3): qty 1

## 2019-07-22 MED ORDER — NALOXONE HCL 0.4 MG/ML IJ SOLN: 0.0800 mg | INTRAMUSCULAR | Status: DC | PRN

## 2019-07-22 MED ORDER — PROMETHAZINE HCL 25 MG PO TABS: 12.5000 mg | ORAL_TABLET | ORAL | Status: DC | PRN

## 2019-07-22 MED ORDER — METHOCARBAMOL 1000 MG/10ML IJ SOLN
1000.0000 mg | Freq: Three times a day (TID) | INTRAVENOUS | Status: DC | PRN
  Filled 2019-07-22: qty 10

## 2019-07-22 MED ORDER — BACITRACIN ZINC 500 UNIT/GM EX OINT
TOPICAL_OINTMENT | CUTANEOUS | Status: DC | PRN
  Administered 2019-07-22: 1 via TOPICAL

## 2019-07-22 MED ORDER — HEPARIN SODIUM (PORCINE) 5000 UNIT/ML IJ SOLN
5000.0000 [IU] | Freq: Three times a day (TID) | INTRAMUSCULAR | Status: DC
  Administered 2019-07-23 – 2019-07-24 (×3): 5000 [IU] via SUBCUTANEOUS
  Filled 2019-07-22 (×4): qty 1

## 2019-07-22 MED ORDER — SUGAMMADEX SODIUM 200 MG/2ML IV SOLN
INTRAVENOUS | Status: DC | PRN
  Administered 2019-07-22: 200 mg via INTRAVENOUS

## 2019-07-22 MED ORDER — ROCURONIUM BROMIDE 10 MG/ML (PF) SYRINGE
PREFILLED_SYRINGE | INTRAVENOUS | Status: DC | PRN
  Administered 2019-07-22: 50 mg via INTRAVENOUS

## 2019-07-22 MED ORDER — BACITRACIN ZINC 500 UNIT/GM EX OINT
TOPICAL_OINTMENT | CUTANEOUS | Status: AC
  Filled 2019-07-22: qty 28.35

## 2019-07-22 MED ORDER — DEXAMETHASONE SODIUM PHOSPHATE 10 MG/ML IJ SOLN
INTRAMUSCULAR | Status: AC
  Filled 2019-07-22: qty 1

## 2019-07-22 MED ORDER — LIDOCAINE 2% (20 MG/ML) 5 ML SYRINGE
INTRAMUSCULAR | Status: DC | PRN
  Administered 2019-07-22: 80 mg via INTRAVENOUS

## 2019-07-22 MED ORDER — FENTANYL CITRATE (PF) 250 MCG/5ML IJ SOLN
INTRAMUSCULAR | Status: AC
  Filled 2019-07-22: qty 5

## 2019-07-22 MED ORDER — HYDROMORPHONE HCL 1 MG/ML IJ SOLN
1.0000 mg | INTRAMUSCULAR | Status: DC | PRN
  Administered 2019-07-22: 1 mg via INTRAVENOUS
  Filled 2019-07-22: qty 1

## 2019-07-22 MED ORDER — LIDOCAINE-EPINEPHRINE 0.5 %-1:200000 IJ SOLN
INTRAMUSCULAR | Status: AC
  Filled 2019-07-22: qty 1

## 2019-07-22 MED ORDER — THROMBIN 20000 UNITS EX SOLR: CUTANEOUS | Status: DC | PRN

## 2019-07-22 MED ORDER — ONDANSETRON HCL 4 MG/2ML IJ SOLN
INTRAMUSCULAR | Status: AC
  Filled 2019-07-22: qty 2

## 2019-07-22 MED ORDER — POTASSIUM CHLORIDE IN NACL 20-0.9 MEQ/L-% IV SOLN
INTRAVENOUS | Status: DC
  Filled 2019-07-22 (×2): qty 1000

## 2019-07-22 MED ORDER — PHENYLEPHRINE 40 MCG/ML (10ML) SYRINGE FOR IV PUSH (FOR BLOOD PRESSURE SUPPORT)
PREFILLED_SYRINGE | INTRAVENOUS | Status: DC | PRN
  Administered 2019-07-22 (×2): 40 ug via INTRAVENOUS
  Administered 2019-07-22 (×5): 80 ug via INTRAVENOUS

## 2019-07-22 MED ORDER — ONDANSETRON HCL 4 MG/2ML IJ SOLN
INTRAMUSCULAR | Status: DC | PRN
  Administered 2019-07-22: 4 mg via INTRAVENOUS

## 2019-07-22 MED ORDER — PROCHLORPERAZINE EDISYLATE 10 MG/2ML IJ SOLN
10.0000 mg | Freq: Four times a day (QID) | INTRAMUSCULAR | Status: DC | PRN
  Administered 2019-07-22 – 2019-07-23 (×2): 10 mg via INTRAVENOUS
  Filled 2019-07-22 (×2): qty 2

## 2019-07-22 MED ORDER — 0.9 % SODIUM CHLORIDE (POUR BTL) OPTIME
TOPICAL | Status: DC | PRN
  Administered 2019-07-22 (×3): 1000 mL

## 2019-07-22 MED ORDER — SODIUM CHLORIDE 0.9 % IV SOLN: INTRAVENOUS | Status: DC | PRN

## 2019-07-22 MED ORDER — HYDROMORPHONE HCL 1 MG/ML IJ SOLN: 0.2500 mg | INTRAMUSCULAR | Status: DC | PRN

## 2019-07-22 MED ORDER — THROMBIN 20000 UNITS EX SOLR
CUTANEOUS | Status: AC
  Filled 2019-07-22: qty 20000

## 2019-07-22 MED ORDER — LACTATED RINGERS IV SOLN: INTRAVENOUS | Status: DC | PRN

## 2019-07-22 MED ORDER — MORPHINE SULFATE (PF) 2 MG/ML IV SOLN
1.0000 mg | INTRAVENOUS | Status: DC | PRN
  Administered 2019-07-22: 2 mg via INTRAVENOUS
  Filled 2019-07-22: qty 1

## 2019-07-22 MED ORDER — PROPOFOL 10 MG/ML IV BOLUS
INTRAVENOUS | Status: DC | PRN
  Administered 2019-07-22: 150 mg via INTRAVENOUS

## 2019-07-22 MED ORDER — FENTANYL CITRATE (PF) 250 MCG/5ML IJ SOLN
INTRAMUSCULAR | Status: DC | PRN
  Administered 2019-07-22 (×2): 50 ug via INTRAVENOUS

## 2019-07-22 MED ORDER — METOPROLOL TARTRATE 5 MG/5ML IV SOLN: 5.0000 mg | Freq: Four times a day (QID) | INTRAVENOUS | Status: DC | PRN

## 2019-07-22 MED ORDER — ONDANSETRON 4 MG PO TBDP
4.0000 mg | ORAL_TABLET | Freq: Four times a day (QID) | ORAL | Status: DC | PRN
  Filled 2019-07-22: qty 1

## 2019-07-22 MED ORDER — FENTANYL CITRATE (PF) 100 MCG/2ML IJ SOLN
INTRAMUSCULAR | Status: AC
  Administered 2019-07-22: 50 ug
  Filled 2019-07-22: qty 2

## 2019-07-22 MED ORDER — HYDROMORPHONE HCL 1 MG/ML IJ SOLN: 0.5000 mg | INTRAMUSCULAR | Status: DC | PRN

## 2019-07-22 MED ORDER — LIDOCAINE-EPINEPHRINE 0.5 %-1:200000 IJ SOLN
INTRAMUSCULAR | Status: DC | PRN
  Administered 2019-07-22: 5 mL
  Administered 2019-07-22: 7 mL

## 2019-07-22 MED ORDER — MIDAZOLAM HCL 2 MG/2ML IJ SOLN
INTRAMUSCULAR | Status: AC
  Filled 2019-07-22: qty 2

## 2019-07-22 MED ORDER — TETANUS-DIPHTH-ACELL PERTUSSIS 5-2.5-18.5 LF-MCG/0.5 IM SUSP
0.5000 mL | Freq: Once | INTRAMUSCULAR | Status: AC
  Administered 2019-07-22: 0.5 mL via INTRAMUSCULAR

## 2019-07-22 MED ORDER — LIDOCAINE 2% (20 MG/ML) 5 ML SYRINGE
INTRAMUSCULAR | Status: AC
  Filled 2019-07-22: qty 5

## 2019-07-22 MED ORDER — SODIUM CHLORIDE 0.9 % IV SOLN
2.0000 g | Freq: Once | INTRAVENOUS | Status: AC
  Administered 2019-07-22: 2 g via INTRAVENOUS
  Filled 2019-07-22: qty 2

## 2019-07-22 SURGICAL SUPPLY — 33 items
CANISTER SUCT 3000ML PPV (MISCELLANEOUS) ×2 IMPLANT
DRAPE NEUROLOGICAL W/INCISE (DRAPES) IMPLANT
DRAPE WARM FLUID 44X44 (DRAPES) ×2 IMPLANT
DRSG TELFA 3X8 NADH (GAUZE/BANDAGES/DRESSINGS) ×2 IMPLANT
ELECT REM PT RETURN 9FT ADLT (ELECTROSURGICAL) ×2
ELECTRODE REM PT RTRN 9FT ADLT (ELECTROSURGICAL) ×1 IMPLANT
GAUZE SPONGE 4X4 12PLY STRL (GAUZE/BANDAGES/DRESSINGS) ×2 IMPLANT
GLOVE BIOGEL PI IND STRL 7.0 (GLOVE) ×3 IMPLANT
GLOVE BIOGEL PI INDICATOR 7.0 (GLOVE) ×3
GLOVE ECLIPSE 6.5 STRL STRAW (GLOVE) ×2 IMPLANT
GLOVE EXAM NITRILE XL STR (GLOVE) IMPLANT
GOWN STRL REUS W/ TWL LRG LVL3 (GOWN DISPOSABLE) ×2 IMPLANT
GOWN STRL REUS W/ TWL XL LVL3 (GOWN DISPOSABLE) ×1 IMPLANT
GOWN STRL REUS W/TWL 2XL LVL3 (GOWN DISPOSABLE) IMPLANT
GOWN STRL REUS W/TWL LRG LVL3 (GOWN DISPOSABLE) ×4
GOWN STRL REUS W/TWL XL LVL3 (GOWN DISPOSABLE) ×2
KIT BASIN OR (CUSTOM PROCEDURE TRAY) ×2 IMPLANT
KIT TURNOVER KIT B (KITS) ×2 IMPLANT
NEEDLE HYPO 25X1 1.5 SAFETY (NEEDLE) ×2 IMPLANT
NS IRRIG 1000ML POUR BTL (IV SOLUTION) ×6 IMPLANT
PACK CRANIOTOMY CUSTOM (CUSTOM PROCEDURE TRAY) ×2 IMPLANT
SPONGE NEURO XRAY DETECT 1X3 (DISPOSABLE) IMPLANT
SPONGE SURGIFOAM ABS GEL 100 (HEMOSTASIS) ×2 IMPLANT
STAPLER VISISTAT 35W (STAPLE) ×2 IMPLANT
SUT ETHILON 3 0 FSL (SUTURE) IMPLANT
SUT ETHILON 3 0 PS 1 (SUTURE) IMPLANT
SUT NURALON 4 0 TR CR/8 (SUTURE) ×6 IMPLANT
SUT STEEL 0 (SUTURE)
SUT STEEL 0 18XMFL TIE 17 (SUTURE) IMPLANT
SUT VIC AB 2-0 CT2 18 VCP726D (SUTURE) ×4 IMPLANT
TOWEL GREEN STERILE (TOWEL DISPOSABLE) ×2 IMPLANT
TOWEL GREEN STERILE FF (TOWEL DISPOSABLE) ×2 IMPLANT
WATER STERILE IRR 1000ML POUR (IV SOLUTION) ×2 IMPLANT

## 2019-07-22 NOTE — ED Notes (Signed)
Mother at bedside Dr. Janee Morn and Dr. Mikal Plane spoke with mother

## 2019-07-22 NOTE — Anesthesia Preprocedure Evaluation (Signed)
Anesthesia Evaluation  Patient identified by MRN, date of birth, ID band Patient awake    Reviewed: Allergy & Precautions, NPO status , Patient's Chart, lab work & pertinent test results  Airway Mallampati: II  TM Distance: >3 FB Neck ROM: Full    Dental no notable dental hx.    Pulmonary Current Smoker,    Pulmonary exam normal breath sounds clear to auscultation       Cardiovascular negative cardio ROS Normal cardiovascular exam Rhythm:Regular Rate:Normal     Neuro/Psych negative neurological ROS  negative psych ROS   GI/Hepatic negative GI ROS, Neg liver ROS,   Endo/Other  negative endocrine ROS  Renal/GU negative Renal ROS  negative genitourinary   Musculoskeletal negative musculoskeletal ROS (+)   Abdominal   Peds negative pediatric ROS (+)  Hematology negative hematology ROS (+)   Anesthesia Other Findings   Reproductive/Obstetrics negative OB ROS                             Anesthesia Physical Anesthesia Plan  ASA: II and emergent  Anesthesia Plan: General   Post-op Pain Management:    Induction: Intravenous and Rapid sequence  PONV Risk Score and Plan: 2 and Ondansetron, Dexamethasone and Treatment may vary due to age or medical condition  Airway Management Planned: Oral ETT  Additional Equipment:   Intra-op Plan:   Post-operative Plan: Extubation in OR  Informed Consent: I have reviewed the patients History and Physical, chart, labs and discussed the procedure including the risks, benefits and alternatives for the proposed anesthesia with the patient or authorized representative who has indicated his/her understanding and acceptance.     Dental advisory given  Plan Discussed with: CRNA and Surgeon  Anesthesia Plan Comments:         Anesthesia Quick Evaluation

## 2019-07-22 NOTE — ED Notes (Signed)
Nose ring given to mother, shorts , black bra and pink sweat shirt cut off prior to arrival . Placed in a paper bag and given to [employer]

## 2019-07-22 NOTE — ED Notes (Signed)
Help get patient undress on the monitor did ekg shown to Dr Criss Alvine did manual blood pressure patient is resting with nurse and family at bedside

## 2019-07-22 NOTE — Transfer of Care (Signed)
Immediate Anesthesia Transfer of Care Note  Patient: Carol Gonzales  Procedure(s) Performed: CRANIAL SUBCUTANEOUS WASHOUT WITH BULLET REMOVAL (N/A Head)  Patient Location: PACU  Anesthesia Type:General  Level of Consciousness: awake, alert  and oriented  Airway & Oxygen Therapy: Patient Spontanous Breathing  Post-op Assessment: Report given to RN, Post -op Vital signs reviewed and stable and Patient moving all extremities  Post vital signs: Reviewed and stable  Last Vitals:  Vitals Value Taken Time  BP 123/70 07/22/19 1854  Temp 36.4 C 07/22/19 1854  Pulse 85 07/22/19 1857  Resp 20 07/22/19 1857  SpO2 100 % 07/22/19 1857  Vitals shown include unvalidated device data.  Last Pain:  Vitals:   07/22/19 1615  TempSrc:   PainSc: 10-Worst pain ever         Complications: No apparent anesthesia complications

## 2019-07-22 NOTE — Anesthesia Postprocedure Evaluation (Signed)
Anesthesia Post Note  Patient: Carol Gonzales  Procedure(s) Performed: CRANIAL SUBCUTANEOUS WASHOUT WITH BULLET REMOVAL (N/A Head)     Patient location during evaluation: Endoscopy Anesthesia Type: General Level of consciousness: awake Pain management: pain level controlled Respiratory status: spontaneous breathing Cardiovascular status: stable Postop Assessment: no apparent nausea or vomiting Anesthetic complications: no    Last Vitals:  Vitals:   07/22/19 2030 07/22/19 2045  BP: 122/72 (!) 116/59  Pulse: (!) 44 (!) 55  Resp: 16 15  Temp:    SpO2: 100% 98%    Last Pain:  Vitals:   07/22/19 2030  TempSrc:   PainSc: 6                  Tiyonna Sardinha
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
## 2019-07-22 NOTE — Anesthesia Procedure Notes (Signed)
Procedure Name: Intubation Date/Time: 07/22/2019 5:37 PM Performed by: Amadeo Garnet, CRNA Pre-anesthesia Checklist: Patient identified, Emergency Drugs available, Suction available and Patient being monitored Patient Re-evaluated:Patient Re-evaluated prior to induction Oxygen Delivery Method: Circle system utilized Preoxygenation: Pre-oxygenation with 100% oxygen Induction Type: Rapid sequence and IV induction Ventilation: Unable to mask ventilate Laryngoscope Size: Mac and 3 Grade View: Grade I Tube type: Oral Tube size: 7.0 mm Number of attempts: 1 Airway Equipment and Method: Stylet Placement Confirmation: ETT inserted through vocal cords under direct vision,  positive ETCO2 and breath sounds checked- equal and bilateral Secured at: 22 cm Tube secured with: Tape Dental Injury: Teeth and Oropharynx as per pre-operative assessment
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
## 2019-07-22 NOTE — Progress Notes (Signed)
CRITICAL VALUE ALERT  Critical Value:  Lactic acid 4.4  Date & Time Notied:  5/6 1550  Provider Notified: Dr. Janee Morn  Orders Received/Actions taken: no new orders at this time
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
## 2019-07-23 LAB — CBC
HCT: 36.8 % (ref 36.0–46.0)
Hemoglobin: 11.1 g/dL — ABNORMAL LOW (ref 12.0–15.0)
MCH: 23.6 pg — ABNORMAL LOW (ref 26.0–34.0)
MCHC: 30.2 g/dL (ref 30.0–36.0)
MCV: 78.1 fL — ABNORMAL LOW (ref 80.0–100.0)
Platelets: 354 10*3/uL (ref 150–400)
RBC: 4.71 MIL/uL (ref 3.87–5.11)
RDW: 17.2 % — ABNORMAL HIGH (ref 11.5–15.5)
WBC: 15.2 10*3/uL — ABNORMAL HIGH (ref 4.0–10.5)
nRBC: 0 % (ref 0.0–0.2)

## 2019-07-23 LAB — BASIC METABOLIC PANEL
Anion gap: 8 (ref 5–15)
BUN: <5 mg/dL — ABNORMAL LOW (ref 6–20)
CO2: 21 mmol/L — ABNORMAL LOW (ref 22–32)
Calcium: 8.4 mg/dL — ABNORMAL LOW (ref 8.9–10.3)
Chloride: 106 mmol/L (ref 98–111)
Creatinine, Ser: 0.61 mg/dL (ref 0.44–1.00)
GFR calc Af Amer: >60 mL/min (ref >60)
GFR calc non Af Amer: >60 mL/min (ref >60)
Glucose, Bld: 108 mg/dL — ABNORMAL HIGH (ref 70–99)
Potassium: 4.3 mmol/L (ref 3.5–5.1)
Sodium: 135 mmol/L (ref 135–145)

## 2019-07-23 LAB — HIV ANTIBODY (ROUTINE TESTING W REFLEX): HIV Screen 4th Generation wRfx: NONREACTIVE

## 2019-07-23 LAB — SAMPLE TO BLOOD BANK

## 2019-07-23 MED ORDER — MENTHOL 3 MG MT LOZG
1.0000 | LOZENGE | OROMUCOSAL | Status: DC | PRN
  Administered 2019-07-23: 3 mg via ORAL
  Filled 2019-07-23: qty 9

## 2019-07-23 MED ORDER — CHLORHEXIDINE GLUCONATE CLOTH 2 % EX PADS: 6.0000 | MEDICATED_PAD | Freq: Every day | CUTANEOUS | Status: DC

## 2019-07-23 MED ORDER — LEVETIRACETAM 500 MG PO TABS
500.0000 mg | ORAL_TABLET | Freq: Two times a day (BID) | ORAL | Status: DC
  Administered 2019-07-23 – 2019-07-24 (×3): 500 mg via ORAL
  Filled 2019-07-23 (×3): qty 1

## 2019-07-23 MED ORDER — PHENOL 1.4 % MT LIQD: 1.0000 | OROMUCOSAL | Status: DC | PRN

## 2019-07-23 MED ORDER — HYDROCODONE-ACETAMINOPHEN 5-325 MG PO TABS
1.0000 | ORAL_TABLET | ORAL | Status: DC | PRN
  Administered 2019-07-23 – 2019-07-24 (×4): 2 via ORAL
  Filled 2019-07-23 (×4): qty 2

## 2019-07-23 MED ORDER — HYDROMORPHONE HCL 1 MG/ML IJ SOLN
0.5000 mg | INTRAMUSCULAR | Status: DC | PRN
  Administered 2019-07-23 – 2019-07-24 (×3): 0.5 mg via INTRAVENOUS
  Filled 2019-07-23 (×3): qty 1

## 2019-07-23 MED ORDER — ALBUTEROL SULFATE (2.5 MG/3ML) 0.083% IN NEBU: 2.5000 mg | INHALATION_SOLUTION | Freq: Four times a day (QID) | RESPIRATORY_TRACT | Status: DC | PRN

## 2019-07-23 NOTE — Social Work (Signed)
CSW met with pt at bedside. CSW introduced self and explained her role. CSW completed sbirt with pt.  Pt scored a 1 on the sbirt scale.Pt reports drinking alcohol socially less than monthly. CSW inquired about substance use. Pt stated she smokes marijuana daily. Pt stated she does not see a problem with her marijuana use. CSW offered resources and pt declined.   Emeterio Reeve, Latanya Presser, [place] Social Worker [phone]

## 2019-07-23 NOTE — Progress Notes (Addendum)
OT Cancellation Note  Patient Details Name: Carol Gonzales MRN: [id number] DOB: 1995-10-29   Cancelled Treatment:    Reason Eval/Treat Not Completed: Other (comment)(Pt sleeping soundly and difficult to wake. Mother at bedside and reports that pt fell asleep upon arrival to [facility]. Will return as schedule allows.)  [name] M [name] [name] MSOT, OTR/L Acute Rehab Pager: [phone] Office: [phone] 07/23/2019, 3:59 PM
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
## 2019-07-23 NOTE — Discharge Instructions (Signed)
Head Injury, Adult There are many types of head injuries. They can be as minor as a bump. Some head injuries can be worse. Worse injuries include:  A strong hit to the head that shakes the brain back and forth causing damage (concussion).  A bruise (contusion) of the brain. This means there is bleeding in the brain that can cause swelling.  A cracked skull (skull fracture).  Bleeding in the brain that gathers, gets thick (makes a clot), and forms a bump (hematoma). Most problems from a head injury come in the first 24 hours. However, you may still have side effects up to 7-10 days after your injury. It is important to watch your condition for any changes. You may need to be watched in the emergency department or urgent care, or you may need to stay in the hospital. What are the causes? There are many possible causes of a head injury. A serious head injury may be caused by:  A car accident.  Bicycle or motorcycle accidents.  Sports injuries.  Falls. What are the signs or symptoms? Symptoms of a head injury include a bruise, bump, or bleeding where the injury happened. Other physical symptoms may include:  Headache.  Feeling sick to your stomach (nauseous) or vomiting.  Dizziness.  Feeling tired.  Being uncomfortable around bright lights or loud noises.  Shaking movements that you cannot control (seizures).  Trouble being woken up.  Passing out (fainting). Mental or emotional symptoms may include:  Feeling grumpy or cranky.  Confusion and memory problems.  Having trouble paying attention or concentrating.  Changes in eating or sleeping habits.  Feeling worried or nervous (anxious).  Feeling sad (depressed). How is this treated? Treatment for this condition depends on how severe the injury is and the type of injury you have. The main goal is to prevent complications and to allow the brain time to heal. Mild head injury If you have a mild head injury, you may be  sent home and treatment may include:  Being watched. A responsible adult should stay with you for 24 hours after your injury and check on you often.  Physical rest.  Brain rest.  Pain medicines. Severe head injury If you have a severe head injury, treatment may include:  Being watched closely. This includes hospitalization with frequent physical exams.  Medicines to: ? Help with pain. ? Prevent shaking movements that you cannot control. ? Help with brain swelling.  Using a machine that helps you breathe (ventilator).  Treatments to manage the swelling inside the brain.  Brain surgery. This may be needed to: ? Remove a blood clot. ? Stop the bleeding. ? Remove a part of the skull. This allows room for the brain to swell. Follow these instructions at home: Activity  Rest.  Avoid activities that are hard or tiring.  Make sure you get enough sleep.  Limit activities that need a lot of thought or attention, such as: ? Watching TV. ? Playing memory games and puzzles. ? Job-related work or homework. ? Working on the computer, social media, and texting.  Avoid activities that could cause another head injury until your doctor says it is okay. This includes playing sports. Having another head injury, especially before the first one has healed, can be dangerous.  Ask your doctor when it is safe for you to go back to your normal activities, such as work or school. Ask your doctor for a step-by-step plan for slowly going back to your normal activities.  Ask   your doctor when you can drive, ride a bicycle, or use heavy machinery. Do not do these activities if you are dizzy. Lifestyle   Do not drink alcohol until your doctor says it is okay.  Do not use drugs.  If it is harder than usual to remember things, write them down.  If you are easily distracted, try to do one thing at a time.  Talk with family members or close friends when making important decisions.  Tell your  friends, family, a trusted coworker, and work manager about your injury, symptoms, and limits (restrictions). Have them watch for any problems that are new or getting worse. General instructions  Take over-the-counter and prescription medicines only as told by your doctor.  Have someone stay with you for 24 hours after your head injury. This person should watch you for any changes in your symptoms and be ready to get help.  Keep all follow-up visits as told by your doctor. This is important. How is this prevented?  Work on your balance and strength. This can help you avoid falls.  Wear a seatbelt when you are in a moving vehicle.  Wear a helmet when you: ? Ride a bicycle. ? Ski. ? Do any other sport or activity that has a risk of injury.  If you drink alcohol: ? Limit how much you use to:  0-1 drink a day for women.  0-2 drinks a day for men. ? Be aware of how much alcohol is in your drink. In the [place] one drink equals one 12 oz bottle of beer (355 mL), one 5 oz glass of wine (148 mL), or one 1 oz glass of hard liquor (44 mL).  Make your home safer by: ? Getting rid of clutter from the floors and stairs. This includes things that can make you trip. ? Using grab bars in bathrooms and handrails by stairs. ? Placing non-slip mats on floors and in bathtubs. ? Putting more light in dim areas. Get help right away if:  You have: ? A very bad headache that is not helped by medicine. ? Trouble walking or weakness in your arms and legs. ? Clear or bloody fluid coming from your nose or ears. ? Changes in how you see (vision). ? Shaking movements that you cannot control.  You lose your balance.  You vomit.  The black centers of your eyes (pupils) change in size.  Your speech is slurred.  Your dizziness gets worse.  You pass out.  You are sleepier than normal and have trouble staying awake.  Your symptoms get worse. These symptoms may be an emergency. Do not wait to see  if the symptoms will go away. Get medical help right away. Call your local emergency services (911 in the [place] Do not drive yourself to the hospital. Summary  There are many types of head injuries. They can be as minor as a bump. Some head injuries can be worse  Treatment for this condition depends on how severe the injury is and the type of injury you have.  Ask your doctor when it is safe for you to go back to your normal activities, such as work or school.  To prevent a head injury, wear a seat belt in a car, wear a helmet when you use a a bicycle, limit your alcohol use, and make your home safer. This information is not intended to replace advice given to you by your health care provider. Make sure you discuss any questions you   have with your health care provider. Document Revised: 06/25/2018 Document Reviewed: 03/27/2018 Elsevier Patient Education  2020 [employer]  
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
## 2019-07-23 NOTE — Progress Notes (Signed)
SLP Cancellation Note  Patient Details Name: Carol Gonzales MRN: [id number] DOB: [birth year]   Cancelled treatment: Orders received for cognitive/communication assessment.  Pt sleeping soundly and difficult to rouse despite multiple attempts.  Boyfriend at bedside and says she has slept heavily the last several hours; he reports good communication and that Teyonna is "herself" except for headache.  SLP will return next date for assessment - note she may be D/Cd tomorrow.  Aracelys Glade L. Samson Frederic, MA CCC/SLP Acute Rehabilitation Services Office number [phone] Pager [phone]           Blenda Mounts Laurice 07/23/2019, 3:09 PM

## 2019-07-24 LAB — CBC
HCT: 34.2 % — ABNORMAL LOW (ref 36.0–46.0)
Hemoglobin: 10.3 g/dL — ABNORMAL LOW (ref 12.0–15.0)
MCH: 23.5 pg — ABNORMAL LOW (ref 26.0–34.0)
MCHC: 30.1 g/dL (ref 30.0–36.0)
MCV: 78.1 fL — ABNORMAL LOW (ref 80.0–100.0)
Platelets: 317 10*3/uL (ref 150–400)
RBC: 4.38 MIL/uL (ref 3.87–5.11)
RDW: 17 % — ABNORMAL HIGH (ref 11.5–15.5)
WBC: 11.2 10*3/uL — ABNORMAL HIGH (ref 4.0–10.5)
nRBC: 0 % (ref 0.0–0.2)

## 2019-07-24 LAB — BASIC METABOLIC PANEL
Anion gap: 7 (ref 5–15)
BUN: 6 mg/dL (ref 6–20)
CO2: 25 mmol/L (ref 22–32)
Calcium: 8.7 mg/dL — ABNORMAL LOW (ref 8.9–10.3)
Chloride: 104 mmol/L (ref 98–111)
Creatinine, Ser: 0.63 mg/dL (ref 0.44–1.00)
GFR calc Af Amer: >60 mL/min (ref >60)
GFR calc non Af Amer: >60 mL/min (ref >60)
Glucose, Bld: 87 mg/dL (ref 70–99)
Potassium: 3.7 mmol/L (ref 3.5–5.1)
Sodium: 136 mmol/L (ref 135–145)

## 2019-07-24 MED ORDER — ONDANSETRON 4 MG PO TBDP: 4.0000 mg | ORAL_TABLET | Freq: Four times a day (QID) | ORAL | 0 refills | Status: DC | PRN

## 2019-07-24 MED ORDER — LEVETIRACETAM 500 MG PO TABS: 500.0000 mg | ORAL_TABLET | Freq: Two times a day (BID) | ORAL | 0 refills | Status: DC

## 2019-07-24 MED ORDER — HYDROCODONE-ACETAMINOPHEN 5-325 MG PO TABS: 1.0000 | ORAL_TABLET | Freq: Four times a day (QID) | ORAL | 0 refills | Status: DC | PRN

## 2019-07-24 NOTE — Plan of Care (Signed)
Patient discharged to home

## 2019-07-24 NOTE — Progress Notes (Signed)
Patient ID: Carol Gonzales, female   DOB: 23-Dec-1995, 23 y.o.   MRN: [id number] Vital signs are stable Patient is awake and alert Speech is fluent No focal deficit Continue therapies.
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
## 2019-07-26 ENCOUNTER — Encounter (HOSPITAL_COMMUNITY): Payer: Self-pay

## 2019-10-15 ENCOUNTER — Encounter: Payer: Self-pay | Admitting: Physical Medicine & Rehabilitation

## 2019-10-26 ENCOUNTER — Encounter: Payer: Medicaid Other | Admitting: Physical Medicine & Rehabilitation

## 2019-10-26 ENCOUNTER — Other Ambulatory Visit: Payer: Self-pay

## 2019-12-16 ENCOUNTER — Encounter: Payer: Medicaid Other | Attending: Physical Medicine & Rehabilitation | Admitting: Physical Medicine & Rehabilitation

## 2020-03-12 ENCOUNTER — Other Ambulatory Visit: Payer: Self-pay

## 2020-03-12 ENCOUNTER — Emergency Department (HOSPITAL_COMMUNITY)
Admission: EM | Admit: 2020-03-12 | Discharge: 2020-03-13 | Disposition: A | Discharge status: Home / Self Care | Payer: Medicaid Other | Attending: Emergency Medicine | Admitting: Emergency Medicine

## 2020-03-12 DIAGNOSIS — U071 COVID-19: Secondary | ICD-10-CM | POA: Diagnosis not present

## 2020-03-12 DIAGNOSIS — Z5321 Procedure and treatment not carried out due to patient leaving prior to being seen by health care provider: Secondary | ICD-10-CM | POA: Insufficient documentation

## 2020-03-12 DIAGNOSIS — R519 Headache, unspecified: Secondary | ICD-10-CM | POA: Diagnosis present

## 2020-03-12 LAB — RESP PANEL BY RT-PCR (FLU A&B, COVID) ARPGX2
Influenza A by PCR: NEGATIVE
Influenza B by PCR: NEGATIVE
SARS Coronavirus 2 by RT PCR: POSITIVE — AB
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
## 2020-03-13 ENCOUNTER — Telehealth (HOSPITAL_COMMUNITY): Payer: Self-pay | Admitting: Nurse Practitioner

## 2020-03-13 ENCOUNTER — Encounter: Payer: Self-pay | Admitting: Nurse Practitioner

## 2020-03-13 NOTE — ED Notes (Addendum)
Pt called 3x no answer  

## 2020-03-13 NOTE — Telephone Encounter (Signed)
Called to Discuss with patient about Covid symptoms and the use of a monoclonal antibody infusion for those with mild to moderate Covid symptoms and at a high risk of hospitalization.     Pt is qualified for this infusion at the [facility] infusion center due to co-morbid conditions and/or a member of an at-risk group.  SVI 4.    Unable to reach pt. Left message to return call. Sent [employer].   Consuello Masse, DNP, AGNP-C [phone] (Infusion Center Hotline)

## 2020-03-14 ENCOUNTER — Telehealth: Payer: Self-pay | Admitting: Nurse Practitioner

## 2020-03-14 NOTE — Telephone Encounter (Signed)
Called to Discuss with patient about Covid symptoms and the use of the monoclonal antibody infusion for those with mild to moderate Covid symptoms and at a high risk of hospitalization.     Pt appears to qualify for this infusion due to co-morbid conditions and/or a member of an at-risk group in accordance with the FDA Emergency Use Authorization.    Unable to reach pt. Per previous attempts by colleague patient qualifies. Unable to leave message.   Willette Alma, NP [facility]  [phone]

## 2020-06-02 ENCOUNTER — Ambulatory Visit (HOSPITAL_COMMUNITY)
Admission: EM | Admit: 2020-06-02 | Discharge: 2020-06-02 | Disposition: A | Discharge status: Home / Self Care | Payer: Medicaid Other | Attending: Medical Oncology | Admitting: Medical Oncology

## 2020-06-02 ENCOUNTER — Encounter (HOSPITAL_COMMUNITY): Payer: Self-pay

## 2020-06-02 ENCOUNTER — Other Ambulatory Visit: Payer: Self-pay

## 2020-06-02 DIAGNOSIS — Z3201 Encounter for pregnancy test, result positive: Secondary | ICD-10-CM | POA: Diagnosis not present

## 2020-06-02 DIAGNOSIS — Z202 Contact with and (suspected) exposure to infections with a predominantly sexual mode of transmission: Secondary | ICD-10-CM

## 2020-06-02 LAB — POC URINE PREG, ED: Preg Test, Ur: POSITIVE — AB

## 2020-06-02 LAB — HIV ANTIBODY (ROUTINE TESTING W REFLEX): HIV Screen 4th Generation wRfx: NONREACTIVE
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
## 2020-06-03 LAB — RPR: RPR Ser Ql: NONREACTIVE

## 2020-06-04 ENCOUNTER — Inpatient Hospital Stay (HOSPITAL_COMMUNITY)
Admission: AD | Admit: 2020-06-04 | Discharge: 2020-06-04 | Disposition: A | Discharge status: Home / Self Care | Payer: Medicaid Other | Attending: Obstetrics & Gynecology | Admitting: Obstetrics & Gynecology

## 2020-06-04 ENCOUNTER — Encounter (HOSPITAL_COMMUNITY): Payer: Self-pay | Admitting: Obstetrics & Gynecology

## 2020-06-04 ENCOUNTER — Other Ambulatory Visit: Payer: Self-pay

## 2020-06-04 DIAGNOSIS — Z711 Person with feared health complaint in whom no diagnosis is made: Secondary | ICD-10-CM | POA: Diagnosis not present

## 2020-06-04 DIAGNOSIS — Z3201 Encounter for pregnancy test, result positive: Secondary | ICD-10-CM
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
## 2020-06-04 NOTE — Discharge Instructions (Signed)
Prenatal Care Providers           [facility]  [address] [phone]  [facility]   [address]  [phone]  [facility] [place]       [address] [phone]            [facility] [place]     [address] [phone]          [facility] [place]   [address] [phone]  [facility]  [address] [phone]     [facility] ()  [address]   [phone]     [facility]  Phone: [phone]  [facility]  Phone: [phone]  [facility] Phone: [phone]  [facility] Phone: [phone]  [facility] Phone: [phone]  [facility] Phone: [phone]  [facility]  Phone: [phone]  

## 2020-06-04 NOTE — MAU Note (Signed)
Pt reports to mau stating she saw a pos preg result in her chart after leaving urgent care Friday.  Pt states she would like to know how far along she is.  Denies vag bleeding or pain at this time.

## 2020-06-05 LAB — CERVICOVAGINAL ANCILLARY ONLY
Bacterial Vaginitis (gardnerella): POSITIVE — AB
Candida Glabrata: NEGATIVE
Candida Vaginitis: NEGATIVE
Chlamydia: POSITIVE — AB
Comment: NEGATIVE
Comment: NEGATIVE
Comment: NEGATIVE
Comment: NEGATIVE
Comment: NEGATIVE
Comment: NORMAL
Neisseria Gonorrhea: NEGATIVE
Trichomonas: NEGATIVE

## 2020-06-06 ENCOUNTER — Telehealth (HOSPITAL_COMMUNITY): Payer: Self-pay | Admitting: Emergency Medicine

## 2020-06-06 MED ORDER — AZITHROMYCIN 250 MG PO TABS: 1000.0000 mg | ORAL_TABLET | Freq: Once | ORAL | 0 refills | Status: AC

## 2020-06-06 MED ORDER — METRONIDAZOLE 0.75 % VA GEL: 1.0000 | Freq: Every day | VAGINAL | 0 refills | Status: AC

## 2020-06-09 ENCOUNTER — Other Ambulatory Visit: Payer: Self-pay

## 2020-06-09 ENCOUNTER — Ambulatory Visit (HOSPITAL_COMMUNITY)
Admission: EM | Admit: 2020-06-09 | Discharge: 2020-06-09 | Disposition: A | Discharge status: Home / Self Care | Payer: Medicaid Other

## 2020-06-09 NOTE — ED Notes (Signed)
Pt arrived to UC for RX . Katie Demetrius Charity RN placed a note reporting meds were e-faxed to [facility] on [address] . This was confirmed by Faye Ramsay PA. Pt DC home

## 2020-12-12 DIAGNOSIS — J45909 Unspecified asthma, uncomplicated: Secondary | ICD-10-CM | POA: Insufficient documentation

## 2020-12-12 DIAGNOSIS — J309 Allergic rhinitis, unspecified: Secondary | ICD-10-CM | POA: Insufficient documentation

## 2020-12-12 DIAGNOSIS — F909 Attention-deficit hyperactivity disorder, unspecified type: Secondary | ICD-10-CM | POA: Insufficient documentation

## 2020-12-12 DIAGNOSIS — A599 Trichomoniasis, unspecified: Secondary | ICD-10-CM | POA: Insufficient documentation

## 2020-12-12 DIAGNOSIS — Z3A32 32 weeks gestation of pregnancy: Secondary | ICD-10-CM | POA: Insufficient documentation

## 2021-01-22 DIAGNOSIS — O4103X1 Oligohydramnios, third trimester, fetus 1: Secondary | ICD-10-CM | POA: Insufficient documentation

## 2021-01-24 DIAGNOSIS — O4100X0 Oligohydramnios, unspecified trimester, not applicable or unspecified: Secondary | ICD-10-CM

## 2021-04-19 ENCOUNTER — Ambulatory Visit: Payer: Self-pay | Admitting: *Deleted

## 2021-04-19 NOTE — Telephone Encounter (Signed)
Reason for Disposition  All other adults with swollen tongue   (Exception: tongue swelling is a recurrent problem AND NO swelling at present)  Answer Assessment - Initial Assessment Questions 1. ONSET: "When did the swelling start?" (e.g., minutes, hours, days)     My tongue is swollen.   It's hard to swallow.   I don't have a runny nose or sore throat.   I feel like it's closing up my throat.  It might be my adenoids but I don't know.   I'm not sick with anything.   I wonder if I'm having an allergic reaction.  2. LOCATION: "What part of the tongue is swollen?"     Day before yesterday it started.   I'm getting short of breath. 3. SEVERITY: "How swollen is it?"     The swelling is not getting better.   It's been the same for the last 3 days but I'm starting to feel short of breath now. 4. CAUSE: "What do you think is causing the tongue swelling?" (e.g., hx of angioedema, allergies)     I don't know 5. RECURRENT SYMPTOM: "Have you had tongue swelling before?" If Yes, ask: "When was the last time?" "What happened that time?"     No 6. OTHER SYMPTOMS: "Do you have any other symptoms?" (e.g., difficulty breathing, facial swelling)     See above 7. PREGNANCY: "Is there any chance you are pregnant?" "When was your last menstrual period?"     Not asked  Protocols used: Tongue Swelling-A-AH

## 2021-04-19 NOTE — Telephone Encounter (Signed)
°  Chief Complaint: tongue is swollen and feeling short of breath.  Feels like throat is closing. Symptoms: Feels like throat is swelling closed and feeling short of breath Frequency: for last 3 days but the shortness of breath is worse today Pertinent Negatives: Patient denies this happening before. Disposition: [x] ED /[] Urgent Care (no appt availability in office) / [] Appointment(In office/virtual)/ []  [facility]/ [] Home Care/ [] Refused Recommended Disposition /[] [facility]/ []  Follow-up with PCP Additional Notes:
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
## 2021-07-28 ENCOUNTER — Ambulatory Visit (HOSPITAL_COMMUNITY)
Admission: EM | Admit: 2021-07-28 | Discharge: 2021-07-28 | Disposition: A | Discharge status: Home / Self Care | Payer: Medicaid Other

## 2021-07-28 NOTE — ED Notes (Signed)
Called from waiting room no answer. Called phone number on file. Sent to VM.  ?

## 2021-08-02 ENCOUNTER — Encounter (HOSPITAL_COMMUNITY): Payer: Self-pay

## 2021-08-02 ENCOUNTER — Ambulatory Visit (HOSPITAL_COMMUNITY)
Admission: EM | Admit: 2021-08-02 | Discharge: 2021-08-02 | Disposition: A | Discharge status: Home / Self Care | Payer: Medicaid Other | Attending: Internal Medicine | Admitting: Internal Medicine

## 2021-08-02 DIAGNOSIS — N926 Irregular menstruation, unspecified: Secondary | ICD-10-CM | POA: Diagnosis not present

## 2021-08-02 DIAGNOSIS — Z3202 Encounter for pregnancy test, result negative: Secondary | ICD-10-CM

## 2021-08-02 LAB — POC URINE PREG, ED: Preg Test, Ur: NEGATIVE

## 2021-08-02 NOTE — ED Triage Notes (Signed)
Pt presents for possible pregnancy. 

## 2021-08-02 NOTE — Discharge Instructions (Addendum)
Pregnancy test was negative today. Please follow-up with OB/GYN for irregular periods.  Call the number I have given you on your discharge paperwork.  I have initiated PCP assistance today and you will be hearing from someone at [facility] to help you set up a primary care provider in [place]. You may also go to [email or web address] to look at scheduling an appointment with a PCP yourself.   If you develop any new or worsening symptoms or do not improve in the next 2 to 3 days, please return.  If your symptoms are severe, please go to the emergency room.  Follow-up with your primary care provider for further evaluation and management of your symptoms as well as ongoing wellness visits.  I hope you feel better!
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
## 2022-01-09 ENCOUNTER — Emergency Department: Admit: 2022-01-09

## 2022-01-09 ENCOUNTER — Inpatient Hospital Stay
Admit: 2022-01-09 | Discharge: 2022-01-09 | Disposition: A | Discharge status: Home / Self Care | Attending: Emergency Medicine

## 2022-01-09 DIAGNOSIS — K567 Ileus, unspecified: Principal | ICD-10-CM

## 2022-01-09 LAB — CBC WITH AUTO DIFFERENTIAL
Absolute Immature Granulocyte: 0 10*3/uL (ref 0.0–0.5)
Basophils %: 1 % (ref 0.0–2.0)
Basophils Absolute: 0.1 10*3/uL (ref 0.0–0.2)
Eosinophils %: 1 % (ref 0.5–7.8)
Eosinophils Absolute: 0.1 10*3/uL (ref 0.0–0.8)
Hematocrit: 33.4 % — ABNORMAL LOW (ref 35.8–46.3)
Hemoglobin: 10.3 g/dL — ABNORMAL LOW (ref 11.7–15.4)
Immature Granulocytes: 0 % (ref 0.0–5.0)
Lymphocytes %: 19 % (ref 13–44)
Lymphocytes Absolute: 1.8 10*3/uL (ref 0.5–4.6)
MCH: 22.5 PG — ABNORMAL LOW (ref 26.1–32.9)
MCHC: 30.8 g/dL — ABNORMAL LOW (ref 31.4–35.0)
MCV: 73.1 FL — ABNORMAL LOW (ref 82.0–102.0)
MPV: 9.5 FL (ref 9.4–12.3)
Monocytes %: 7 % (ref 4.0–12.0)
Monocytes Absolute: 0.7 10*3/uL (ref 0.1–1.3)
Neutrophils %: 71 % (ref 43–78)
Neutrophils Absolute: 6.5 10*3/uL (ref 1.7–8.2)
Platelets: 505 10*3/uL — ABNORMAL HIGH (ref 150–450)
RBC: 4.57 M/uL (ref 4.05–5.2)
RDW: 18.5 % — ABNORMAL HIGH (ref 11.9–14.6)
WBC: 9 10*3/uL (ref 4.3–11.1)
nRBC: 0 10*3/uL (ref 0.0–0.2)

## 2022-01-09 LAB — COMPREHENSIVE METABOLIC PANEL
ALT: 18 U/L (ref 12–65)
AST: 49 U/L — ABNORMAL HIGH (ref 15–37)
Albumin/Globulin Ratio: 0.7 (ref 0.4–1.6)
Albumin: 3.1 g/dL — ABNORMAL LOW (ref 3.5–5.0)
Alk Phosphatase: 69 U/L (ref 50–136)
Anion Gap: 5 mmol/L (ref 2–11)
BUN: 8 MG/DL (ref 6–23)
CO2: 23 mmol/L (ref 21–32)
Calcium: 8.7 MG/DL (ref 8.3–10.4)
Chloride: 108 mmol/L (ref 101–110)
Creatinine: 0.54 MG/DL — ABNORMAL LOW (ref 0.6–1.0)
Est, Glom Filt Rate: >60 mL/min/{1.73_m2} (ref >60)
Globulin: 4.6 g/dL — ABNORMAL HIGH (ref 2.8–4.5)
Glucose: 90 mg/dL (ref 65–100)
Potassium: 5.9 mmol/L — ABNORMAL HIGH (ref 3.5–5.1)
Sodium: 136 mmol/L (ref 133–143)
Total Bilirubin: 0.4 MG/DL (ref 0.2–1.1)
Total Protein: 7.7 g/dL (ref 6.3–8.2)

## 2022-01-09 LAB — URINALYSIS, MICRO
Casts: 0 /lpf
Crystals: 0 /LPF
Mucus, UA: 0 /lpf

## 2022-01-09 LAB — POC PREGNANCY UR-QUAL: Preg Test, Ur: NEGATIVE

## 2022-01-09 LAB — LIPASE: Lipase: 33 U/L — ABNORMAL LOW (ref 73–393)

## 2022-01-09 MED ORDER — METOCLOPRAMIDE HCL 5 MG PO TABS
5 MG | ORAL_TABLET | Freq: Three times a day (TID) | ORAL | 0 refills | Status: AC | PRN
Start: 2022-01-09 — End: ?

## 2022-01-09 MED ORDER — METOCLOPRAMIDE HCL 10 MG PO TABS
10 MG | ORAL | Status: AC
Start: 2022-01-09 — End: 2022-01-09
  Administered 2022-01-09: 09:00:00 10 mg via ORAL

## 2022-01-09 MED ORDER — DICYCLOMINE HCL 10 MG PO CAPS
10 MG | ORAL | Status: AC
Start: 2022-01-09 — End: 2022-01-09
  Administered 2022-01-09: 09:00:00 20 mg via ORAL

## 2022-01-09 MED ORDER — ALUM & MAG HYDROXIDE-SIMETH 200-200-20 MG/5ML PO SUSP
200-200-20 MG/5ML | ORAL | Status: AC
Start: 2022-01-09 — End: 2022-01-09
  Administered 2022-01-09: 09:00:00 30 mL via ORAL

## 2022-01-09 MED ORDER — KETOROLAC TROMETHAMINE 30 MG/ML IJ SOLN
30 MG/ML | INTRAMUSCULAR | Status: AC
Start: 2022-01-09 — End: 2022-01-09
  Administered 2022-01-09: 07:00:00 30 mg via INTRAVENOUS

## 2022-01-09 MED ORDER — DICYCLOMINE HCL 20 MG PO TABS
20 MG | ORAL_TABLET | Freq: Three times a day (TID) | ORAL | 0 refills | Status: AC | PRN
Start: 2022-01-09 — End: 2022-01-23

## 2022-01-09 MED FILL — METOCLOPRAMIDE HCL 10 MG PO TABS: 10 MG | ORAL | Qty: 1

## 2022-01-09 MED FILL — MAG-AL PLUS 200-200-20 MG/5ML PO LIQD: 200-200-20 MG/5ML | ORAL | Qty: 30

## 2022-01-09 MED FILL — DICYCLOMINE HCL 10 MG PO CAPS: 10 MG | ORAL | Qty: 2

## 2022-01-09 MED FILL — KETOROLAC TROMETHAMINE 30 MG/ML IJ SOLN: 30 MG/ML | INTRAMUSCULAR | Qty: 1

## 2022-01-09 NOTE — ED Provider Notes (Signed)
Emergency Department Provider Note       PCP: No, Pcp   Age: 26 y.o.   Sex: female     DISPOSITION Decision To Discharge 01/09/2022 04:30:02 AM       ICD-10-CM    1. Ileus ([place])  K56.7       2. Abdominal pain, unspecified abdominal location  R10.9           Medical Decision Making     Complexity of Problems Addressed:  1 or more acute illnesses that pose a threat to life or bodily function.     Data Reviewed and Analyzed:  I independently ordered and reviewed each unique test.         I interpreted the X-rays no small bowel obstruction.    Discussion of management or test interpretation.    DDX:    DDX:    IUP, musculoskeletal pain, ovarian cyst, endometriosis, uterine fibroid, ovarian torsion,    UTI, Bacterial vaginitis vaginosis, yeast vaginitis, Trichomonas, pelvic inflammatory  disease, cervicitis, STD, gonorrhea, chlamydia, syphilis, HIV    Viral infection, gastroenteritis, viral adenitis, pseudomembranous colitis, inflammatory  bowel disease, infectious diarrhea    Constipation, fecal impaction, small bowel obstruction, partial small bowel obstruction,  Ileus    gallbladder disease, cholecystitis, diverticulitis, appendicitis, appendicitis with rupture,    UTI, pyelonephritis, renal colic, ureteral stone     Peptic ulcer disease, esophagitis, GERD    Pancreatitis, pancreatic pseudocyst,        Risk of Complications and/or Morbidity of Patient Management:  Prescription drug management performed.  Shared medical decision making was utilized in creating the patients health plan today.    ED Course as of 01/09/22 0431   Wed Jan 09, 2022   0414 Potassium(!): 5.9  Specimen was hemolyzed [KH]   0429 I talked to the patient by the findings in the emergency department.  She did not have any relief of her pain with the Toradol.  She will be given a prescription for Reglan and Bentyl along with first doses of both these medications here in the emergency department.  We talked about the management of her ileus and  outpatient follow-up with family medicine doctor if symptoms do not start to improve.  Patient is agreeable with this plan. [KH]      ED Course User Index  [KH] Keene Breath, DO       History       27 year old female presenting to the emergency department today complaining of abdominal pain since her cycle ended on 12/24/2021.  The patient states that it feels like something is loose in her abdomen and moves when she rolls from side to side.  Patient states that she has not had any fevers but she feels hot.  She has had some diarrhea and has had increased gas belching and flatulence.  Patient denies any chest pain but said tonight after getting off work around midnight it seemed like it was hard to take a deep breath in because of the abdominal discomfort.           Review of Systems   Gastrointestinal:  Positive for abdominal pain, diarrhea and nausea. Negative for vomiting.       Physical Exam     Vitals signs and nursing note reviewed:  Vitals:    01/09/22 0108   BP: 129/76   Pulse: 86   Resp: 18   Temp: 97.7 F (36.5 C)   TempSrc: Oral   SpO2: 98%  Weight: 95.7 kg (211 lb)   Height: 1.651 m (_0 )      Physical Exam     GENERAL:The patient has Body mass index is 35.11 kg/m. Well-hydrated.    VITAL SIGNS: Heart rate, blood pressure, respiratory rate reviewed as recorded in  nurse's notes  EYES: Pupils reactive. Extraocular motion intact. No conjunctival redness or drainage.  MOUTH/THROAT: Pharynx clear; airway patent.  NECK: Supple, no meningeal signs. Trachea midline. No masses or thyromegaly.  LUNGS: Breath sounds clear and equal bilaterally no accessory muscle use.  CHEST: No deformity  CARDIOVASCULAR: Regular rate and rhythm  ABDOMEN: Soft with suprapubic tenderness. No palpable masses or organomegaly. No  peritoneal signs. No rigidity.  Negative Murphy, McBurney and Rovsing signs  EXTREMITIES: No clubbing or cyanosis. No joint swelling. Normal muscle tone. No  restricted range of motion  appreciated.  NEUROLOGIC: Sensation is grossly intact. Cranial nerve exam reveals face is  symmetrical, tongue is midline speech is clear.  SKIN: No rash or petechiae. Good skin turgor palpated.  PSYCHIATRIC: Alert and oriented. Appropriate behavior and judgment.      Procedures     Procedures    Orders Placed This Encounter   Procedures    XR ACUTE ABD SERIES CHEST 1 VW    CBC with Diff    CMP    Lipase    Urinalysis, Micro    Diet NPO    POCT Urine Dipstick    POC Pregnancy Urine Qual    POC Pregnancy Urine Qual    Saline lock IV        Medications given during this emergency department visit:  Medications   metoclopramide (REGLAN) tablet 10 mg (has no administration in time range)   dicyclomine (BENTYL) capsule 20 mg (has no administration in time range)   aluminum & magnesium hydroxide-simethicone (MAALOX) 200-200-20 MG/5ML suspension 30 mL (has no administration in time range)   ketorolac (TORADOL) injection 30 mg (30 mg IntraVENous Given 01/09/22 0304)       New Prescriptions    DICYCLOMINE (BENTYL) 20 MG TABLET    Take 1 tablet by mouth 3 times daily as needed (abd pain)    METOCLOPRAMIDE (REGLAN) 5 MG TABLET    Take 1 tablet by mouth 3 times daily as needed (nausea/vomiting)        History reviewed. No pertinent past medical history.     History reviewed. No pertinent surgical history.     Social History     Socioeconomic History    Marital status: Unknown     Spouse name: None    Number of children: None    Years of education: None    Highest education level: None   Tobacco Use    Smoking status: Some Days     Types: Cigarettes    Smokeless tobacco: Current   Substance and Sexual Activity    Alcohol use: Not Currently    Drug use: Never        Previous Medications    No medications on file        Results for orders placed or performed during the hospital encounter of 01/09/22   XR ACUTE ABD SERIES CHEST 1 VW    Narrative    EXAM: Acute abdominal series    DATE: January 09, 2022    HISTORY: Abdominal  pain    COMPARISON: None available    TECHNIQUE: Single frontal view of the chest with 3 additional radiographs of the   abdomen were  obtained    FINDINGS:    The heart is borderline in size. Mediastinum and pulmonary vasculature are   unremarkable. The lungs are free of focal airspace consolidation. There is no   pneumothorax or sizable pleural fluid collection.    There is no free intraperitoneal air. There is a moderate amount of retained   colonic fecal debris. This is a nonspecific bowel gas pattern. There are a few   air-fluid levels within small bowel loops in the right lower quadrant.    Osseous structures are intact.      Impression    1. Nonspecific bowel gas pattern with a few air-fluid levels within small bowel   loops in the right lower quadrant. Differential considerations would include   sequelae of gastroenteritis, mild small bowel ileus or partial bowel   obstruction.  2. Borderline cardiomegaly.     Laverda Sorenson, M.D.   01/09/2022 3:19:00 AM   CBC with Diff   Result Value Ref Range    WBC 9.0 4.3 - 11.1 K/uL    RBC 4.57 4.05 - 5.2 M/uL    Hemoglobin 10.3 (L) 11.7 - 15.4 g/dL    Hematocrit 33.4 (L) 35.8 - 46.3 %    MCV 73.1 (L) 82.0 - 102.0 FL    MCH 22.5 (L) 26.1 - 32.9 PG    MCHC 30.8 (L) 31.4 - 35.0 g/dL    RDW 18.5 (H) 11.9 - 14.6 %    Platelets 505 (H) 150 - 450 K/uL    MPV 9.5 9.4 - 12.3 FL    nRBC 0.00 0.0 - 0.2 K/uL    Differential Type AUTOMATED      Neutrophils % 71 43 - 78 %    Lymphocytes % 19 13 - 44 %    Monocytes % 7 4.0 - 12.0 %    Eosinophils % 1 0.5 - 7.8 %    Basophils % 1 0.0 - 2.0 %    Immature Granulocytes 0 0.0 - 5.0 %    Neutrophils Absolute 6.5 1.7 - 8.2 K/UL    Lymphocytes Absolute 1.8 0.5 - 4.6 K/UL    Monocytes Absolute 0.7 0.1 - 1.3 K/UL    Eosinophils Absolute 0.1 0.0 - 0.8 K/UL    Basophils Absolute 0.1 0.0 - 0.2 K/UL    Absolute Immature Granulocyte 0.0 0.0 - 0.5 K/UL   CMP   Result Value Ref Range    Sodium 136 133 - 143 mmol/L    Potassium 5.9 (H) 3.5 - 5.1 mmol/L     Chloride 108 101 - 110 mmol/L    CO2 23 21 - 32 mmol/L    Anion Gap 5 2 - 11 mmol/L    Glucose 90 65 - 100 mg/dL    BUN 8 6 - 23 MG/DL    Creatinine 0.54 (L) 0.6 - 1.0 MG/DL    Est, Glom Filt Rate >60 >60 ml/min/1.77m    Calcium 8.7 8.3 - 10.4 MG/DL    Total Bilirubin 0.4 0.2 - 1.1 MG/DL    ALT 18 12 - 65 U/L    AST 49 (H) 15 - 37 U/L    Alk Phosphatase 69 50 - 136 U/L    Total Protein 7.7 6.3 - 8.2 g/dL    Albumin 3.1 (L) 3.5 - 5.0 g/dL    Globulin 4.6 (H) 2.8 - 4.5 g/dL    Albumin/Globulin Ratio 0.7 0.4 - 1.6     Lipase   Result Value Ref Range  Lipase 33 (L) 73 - 393 U/L   Urinalysis, Micro   Result Value Ref Range    WBC, UA 20-50 0 /hpf    RBC, UA 0-3 0 /hpf    Epithelial Cells UA 20-50 0 /hpf    BACTERIA, URINE 1+ (H) 0 /hpf    Casts 0 0 /lpf    Crystals 0 0 /LPF    Mucus, UA 0 0 /lpf   POC Pregnancy Urine Qual   Result Value Ref Range    Preg Test, Ur Negative NEG          XR ACUTE ABD SERIES CHEST 1 VW   Final Result   1. Nonspecific bowel gas pattern with a few air-fluid levels within small bowel    loops in the right lower quadrant. Differential considerations would include    sequelae of gastroenteritis, mild small bowel ileus or partial bowel    obstruction.   2. Borderline cardiomegaly.       Laverda Sorenson, M.D.    01/09/2022 3:19:00 AM                        Voice dictation software was used during the making of this note.  This software is not perfect and grammatical and other typographical errors may be present.  This note has not been completely proofread for errors.      Keene Breath, DO  01/09/22 [phone]

## 2022-01-09 NOTE — Discharge Instructions (Addendum)
Take the Reglan and Bentyl as needed.  You can also take simethicone or Gas-X as needed.  Continue to eat a regular diet as tolerated.  If symptoms do not start to improve you need to follow-up with your family doctor in 32 hours.  If you start develop new concerning symptoms please return to the ER immediately.    We would love to help you get a primary care doctor for follow-up after your emergency department visit.    Please call [phone] between 7AM - 6PM Monday to Friday.  A care navigator will be able to assist you with setting up a doctor close to your home.

## 2022-01-09 NOTE — ED Triage Notes (Signed)
Pt presents to Ed with c/o abdominal pain for four days. Pt states she has also had some diarrhea.

## 2022-01-09 NOTE — ED Notes (Signed)
I have reviewed discharge instructions with the patient.  The patient verbalized understanding.    Patient left ED via Discharge Method: ambulatory to Home with self.     Opportunity for questions and clarification provided.       Patient given 2 scripts.         To continue your aftercare when you leave the hospital, you may receive an automated call from our care team to check in on how you are doing.  This is a free service and part of our promise to provide the best care and service to meet your aftercare needs." If you have questions, or wish to unsubscribe from this service please call [phone].  Thank you for Choosing our [facility] Emergency Department.        Aliene Altes, RN  01/09/22 [phone]

## 2022-03-24 ENCOUNTER — Encounter (HOSPITAL_COMMUNITY): Payer: Self-pay | Admitting: *Deleted

## 2022-03-24 ENCOUNTER — Ambulatory Visit (HOSPITAL_COMMUNITY)
Admission: EM | Admit: 2022-03-24 | Discharge: 2022-03-24 | Disposition: A | Discharge status: Home / Self Care | Payer: Medicaid Other | Attending: Emergency Medicine | Admitting: Emergency Medicine

## 2022-03-24 DIAGNOSIS — Z3202 Encounter for pregnancy test, result negative: Secondary | ICD-10-CM | POA: Diagnosis not present

## 2022-03-24 DIAGNOSIS — R300 Dysuria: Secondary | ICD-10-CM | POA: Insufficient documentation

## 2022-03-24 DIAGNOSIS — Z202 Contact with and (suspected) exposure to infections with a predominantly sexual mode of transmission: Secondary | ICD-10-CM | POA: Diagnosis not present

## 2022-03-24 HISTORY — DX: Accidental discharge from unspecified firearms or gun, initial encounter: W34.00XA

## 2022-03-24 HISTORY — DX: Unspecified firearm discharge, undetermined intent, initial encounter: Y24.9XXA

## 2022-03-24 LAB — POCT URINALYSIS DIPSTICK, ED / UC
Bilirubin Urine: NEGATIVE
Glucose, UA: NEGATIVE mg/dL
Hgb urine dipstick: NEGATIVE
Ketones, ur: NEGATIVE mg/dL
Leukocytes,Ua: NEGATIVE
Nitrite: NEGATIVE
Protein, ur: NEGATIVE mg/dL
Specific Gravity, Urine: 1.02 (ref 1.005–1.030)
Urobilinogen, UA: 0.2 mg/dL (ref 0.0–1.0)
pH: 7.5 (ref 5.0–8.0)

## 2022-03-24 LAB — POC URINE PREG, ED: Preg Test, Ur: NEGATIVE

## 2022-03-24 LAB — HIV ANTIBODY (ROUTINE TESTING W REFLEX): HIV Screen 4th Generation wRfx: NONREACTIVE

## 2022-03-24 NOTE — Discharge Instructions (Addendum)
Your urine pregnancy test is negative. Your urinalysis does not show signs of urinary tract infection.  Your STD testing has been sent to the lab and will come back in the next 2 to 3 days.  We will call you if any of your results are positive requiring treatment and treat you at that time.   Avoid sexual intercourse until your STD results come back.  If any of your STD results are positive, you will need to avoid sexual intercourse for 7 days while you are being treated to prevent spread of STD.  Condom use is the best way to prevent spread of STDs.  Return to urgent care as needed.

## 2022-03-24 NOTE — ED Triage Notes (Signed)
Pt wishes to have STD testing. Denies any vaginal discharge or sxs.
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
## 2022-03-25 LAB — CERVICOVAGINAL ANCILLARY ONLY
Bacterial Vaginitis (gardnerella): POSITIVE — AB
Candida Glabrata: NEGATIVE
Candida Vaginitis: NEGATIVE
Chlamydia: NEGATIVE
Comment: NEGATIVE
Comment: NEGATIVE
Comment: NEGATIVE
Comment: NEGATIVE
Comment: NEGATIVE
Comment: NORMAL
Neisseria Gonorrhea: NEGATIVE
Trichomonas: POSITIVE — AB

## 2022-03-25 LAB — SYPHILIS: RPR W/REFLEX TO RPR TITER AND TREPONEMAL ANTIBODIES, TRADITIONAL SCREENING AND DIAGNOSIS ALGORITHM: RPR Ser Ql: NONREACTIVE

## 2022-03-26 ENCOUNTER — Telehealth (HOSPITAL_COMMUNITY): Payer: Self-pay | Admitting: Emergency Medicine

## 2022-03-26 MED ORDER — METRONIDAZOLE 500 MG PO TABS: 500.0000 mg | ORAL_TABLET | Freq: Two times a day (BID) | ORAL | 0 refills | Status: DC

## 2022-03-27 ENCOUNTER — Telehealth (HOSPITAL_COMMUNITY): Payer: Self-pay | Admitting: Emergency Medicine

## 2022-03-27 NOTE — Telephone Encounter (Signed)
Patient states she cannot afford Metronidazole prescription and would like to come to clinic for Trichomonas treatment with one time dose of Metronidazole.  This will NOT cover her BV, so will need to discuss options with provider.  Treating Trichomonas at clinic is NOT a protocol, she will need to do a visit.

## 2022-03-30 ENCOUNTER — Ambulatory Visit (HOSPITAL_COMMUNITY)
Admission: EM | Admit: 2022-03-30 | Discharge: 2022-03-30 | Disposition: A | Discharge status: Home / Self Care | Payer: Medicaid Other | Attending: Emergency Medicine | Admitting: Emergency Medicine

## 2022-03-30 MED ORDER — METRONIDAZOLE 500 MG PO TABS: 2000.0000 mg | ORAL_TABLET | Freq: Once | ORAL | Status: DC

## 2022-03-30 MED ORDER — METRONIDAZOLE 500 MG PO TABS: 500.0000 mg | ORAL_TABLET | Freq: Two times a day (BID) | ORAL | 0 refills | Status: AC

## 2022-03-30 NOTE — ED Provider Notes (Signed)
Here for treatment for positive BV and Trichomonas Set up patient with [employer] account Refilled metronidazole BID x 7 days, only $4 with coupon Advised not to take with alcohol No questions at this time   Les Pou, PA-C 03/30/22 1426

## 2022-03-30 NOTE — ED Triage Notes (Signed)
Triaged by provider  

## 2022-04-23 ENCOUNTER — Ambulatory Visit
Admission: EM | Admit: 2022-04-23 | Discharge: 2022-04-23 | Disposition: A | Discharge status: Home / Self Care | Payer: Medicaid Other | Attending: Physician Assistant | Admitting: Physician Assistant

## 2022-04-23 DIAGNOSIS — N898 Other specified noninflammatory disorders of vagina: Secondary | ICD-10-CM | POA: Insufficient documentation

## 2022-04-23 DIAGNOSIS — Z113 Encounter for screening for infections with a predominantly sexual mode of transmission: Secondary | ICD-10-CM | POA: Diagnosis present

## 2022-04-23 LAB — POCT URINALYSIS DIP (MANUAL ENTRY)
Bilirubin, UA: NEGATIVE
Blood, UA: NEGATIVE
Glucose, UA: NEGATIVE mg/dL
Ketones, POC UA: NEGATIVE mg/dL
Nitrite, UA: NEGATIVE
Protein Ur, POC: 100 mg/dL — AB
Spec Grav, UA: 1.025 (ref 1.010–1.025)
Urobilinogen, UA: 0.2 E.U./dL
pH, UA: 7.5 (ref 5.0–8.0)

## 2022-04-23 LAB — POCT URINE PREGNANCY: Preg Test, Ur: NEGATIVE

## 2022-04-23 MED ORDER — CLOTRIMAZOLE 1 % EX CREA: TOPICAL_CREAM | CUTANEOUS | 0 refills | Status: DC

## 2022-04-23 NOTE — ED Triage Notes (Signed)
Pt c/o vaginal pressure x3 days, denies urinary sx's. States hasn't had inter course since she was tx'd for trich and BV. States she used a different soap 3 days ago.
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
## 2022-04-23 NOTE — Discharge Instructions (Addendum)
Will Be completed in 48 hours.  If you do not get a call from this office that indicates the labs are negative.  Log onto MyChart to view the test results when it post in 48 hours.   I did send a prescription in for Lotrimin cream.  You can use this externally to the irritated area if the A&E ointment does not improve the symptoms.  The Lotrimin cream treats yeast and fungal type infections externally.  Advised follow-up PCP or return to urgent care as needed.

## 2022-04-25 ENCOUNTER — Telehealth (HOSPITAL_COMMUNITY): Payer: Self-pay | Admitting: Emergency Medicine

## 2022-04-25 LAB — CERVICOVAGINAL ANCILLARY ONLY
Bacterial Vaginitis (gardnerella): POSITIVE — AB
Candida Glabrata: NEGATIVE
Candida Vaginitis: POSITIVE — AB
Chlamydia: NEGATIVE
Comment: NEGATIVE
Comment: NEGATIVE
Comment: NEGATIVE
Comment: NEGATIVE
Comment: NEGATIVE
Comment: NORMAL
Neisseria Gonorrhea: NEGATIVE
Trichomonas: NEGATIVE

## 2022-04-25 MED ORDER — METRONIDAZOLE 0.75 % VA GEL: 1.0000 | Freq: Every day | VAGINAL | 0 refills | Status: AC

## 2022-04-25 MED ORDER — FLUCONAZOLE 150 MG PO TABS: 150.0000 mg | ORAL_TABLET | Freq: Once | ORAL | 0 refills | Status: AC

## 2022-06-03 ENCOUNTER — Ambulatory Visit
Admission: EM | Admit: 2022-06-03 | Discharge: 2022-06-03 | Disposition: A | Discharge status: Home / Self Care | Payer: Medicaid Other | Attending: Physician Assistant | Admitting: Physician Assistant

## 2022-06-03 DIAGNOSIS — Z113 Encounter for screening for infections with a predominantly sexual mode of transmission: Secondary | ICD-10-CM | POA: Diagnosis not present

## 2022-06-03 DIAGNOSIS — N912 Amenorrhea, unspecified: Secondary | ICD-10-CM | POA: Diagnosis not present

## 2022-06-03 LAB — POCT URINE PREGNANCY: Preg Test, Ur: NEGATIVE

## 2022-06-03 NOTE — ED Triage Notes (Signed)
Pt c/o bv and yeast medication from [facility] being too expensive so she did not get it. States she does not have discharge or odor. Something about a friend telling her to come here and we will treat her.
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
## 2022-06-03 NOTE — Discharge Instructions (Addendum)
Lab testing will be completed in 48 hours.  If you do not get a call from this office that indicates the test is negative.  Log onto MyChart the test results when they post in 48 hours.  Advised follow-up PCP return to urgent care if symptoms fail to improve.

## 2022-06-04 ENCOUNTER — Encounter: Payer: Self-pay | Admitting: Emergency Medicine

## 2022-06-04 ENCOUNTER — Ambulatory Visit
Admission: EM | Admit: 2022-06-04 | Discharge: 2022-06-04 | Disposition: A | Discharge status: Home / Self Care | Payer: Medicaid Other | Attending: Internal Medicine | Admitting: Internal Medicine

## 2022-06-04 ENCOUNTER — Telehealth (HOSPITAL_COMMUNITY): Payer: Self-pay | Admitting: Emergency Medicine

## 2022-06-04 DIAGNOSIS — Z202 Contact with and (suspected) exposure to infections with a predominantly sexual mode of transmission: Secondary | ICD-10-CM | POA: Diagnosis not present

## 2022-06-04 LAB — CERVICOVAGINAL ANCILLARY ONLY
Bacterial Vaginitis (gardnerella): POSITIVE — AB
Candida Glabrata: NEGATIVE
Candida Vaginitis: NEGATIVE
Chlamydia: POSITIVE — AB
Comment: NEGATIVE
Comment: NEGATIVE
Comment: NEGATIVE
Comment: NEGATIVE
Comment: NEGATIVE
Comment: NORMAL
Neisseria Gonorrhea: POSITIVE — AB
Trichomonas: NEGATIVE

## 2022-06-04 MED ORDER — CEFTRIAXONE SODIUM 1 G IJ SOLR
0.5000 g | Freq: Once | INTRAMUSCULAR | Status: AC
  Administered 2022-06-04: 0.5 g via INTRAMUSCULAR

## 2022-06-04 MED ORDER — DOXYCYCLINE HYCLATE 100 MG PO CAPS: 100.0000 mg | ORAL_CAPSULE | Freq: Two times a day (BID) | ORAL | 0 refills | Status: AC

## 2022-06-04 MED ORDER — METRONIDAZOLE 0.75 % VA GEL: 1.0000 | Freq: Every day | VAGINAL | 0 refills | Status: AC

## 2022-06-04 NOTE — ED Triage Notes (Signed)
Pt here for testing positive for sexual transmitted disease. Needs medication

## 2023-02-08 ENCOUNTER — Encounter (HOSPITAL_COMMUNITY): Payer: Self-pay

## 2023-02-08 ENCOUNTER — Ambulatory Visit (HOSPITAL_COMMUNITY)
Admission: EM | Admit: 2023-02-08 | Discharge: 2023-02-08 | Disposition: A | Discharge status: Home / Self Care | Payer: MEDICAID | Attending: Emergency Medicine | Admitting: Emergency Medicine

## 2023-02-08 DIAGNOSIS — N760 Acute vaginitis: Secondary | ICD-10-CM

## 2023-02-08 DIAGNOSIS — Z113 Encounter for screening for infections with a predominantly sexual mode of transmission: Secondary | ICD-10-CM | POA: Diagnosis present

## 2023-02-08 DIAGNOSIS — R1032 Left lower quadrant pain: Secondary | ICD-10-CM | POA: Diagnosis present

## 2023-02-08 LAB — HIV ANTIBODY (ROUTINE TESTING W REFLEX): HIV Screen 4th Generation wRfx: NONREACTIVE

## 2023-02-08 NOTE — Discharge Instructions (Addendum)
You have been screened for sexually transmitted infections as well as BV and yeast.  Results would be available on MyChart and our staff will contact you if anything results is abnormal.  Abstain from intercourse for the next 7 days and notify any sexual partners of any positive results.  The left lower quadrant pain may be due to gastrointestinal inflammation with your recent diarrhea or to a sexually transmitted infection.  Return to clinic or seek immediate care if you have any vomiting, fevers, sudden extreme abdominal pain, or any new concerning symptoms.

## 2023-02-08 NOTE — ED Triage Notes (Addendum)
Patient here today to be tested for all STDs. Patient states that she started her menstrual yesterday and feels that she had some vaginal itching 2 days ago. Patient also states that she had some LLQ abd pain.
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
## 2023-02-09 LAB — RPR: RPR Ser Ql: NONREACTIVE

## 2023-02-10 ENCOUNTER — Telehealth (HOSPITAL_COMMUNITY): Payer: Self-pay | Admitting: Emergency Medicine

## 2023-02-10 LAB — CERVICOVAGINAL ANCILLARY ONLY
Bacterial Vaginitis (gardnerella): POSITIVE — AB
Candida Glabrata: NEGATIVE
Candida Vaginitis: POSITIVE — AB
Chlamydia: NEGATIVE
Comment: NEGATIVE
Comment: NEGATIVE
Comment: NEGATIVE
Comment: NEGATIVE
Comment: NEGATIVE
Comment: NORMAL
Neisseria Gonorrhea: NEGATIVE
Trichomonas: NEGATIVE

## 2023-02-10 MED ORDER — FLUCONAZOLE 150 MG PO TABS: 150.0000 mg | ORAL_TABLET | Freq: Once | ORAL | 0 refills | Status: AC

## 2023-02-10 MED ORDER — METRONIDAZOLE 500 MG PO TABS: 500.0000 mg | ORAL_TABLET | Freq: Two times a day (BID) | ORAL | 0 refills | Status: DC

## 2023-02-10 NOTE — Telephone Encounter (Signed)
Metronidazole for positive BV, diflucan for positive yeast, per protocol

## 2023-06-02 ENCOUNTER — Encounter (HOSPITAL_COMMUNITY): Payer: Self-pay | Admitting: Emergency Medicine

## 2023-06-02 ENCOUNTER — Other Ambulatory Visit: Payer: Self-pay

## 2023-06-02 ENCOUNTER — Ambulatory Visit (HOSPITAL_COMMUNITY)
Admission: EM | Admit: 2023-06-02 | Discharge: 2023-06-02 | Disposition: A | Discharge status: Home / Self Care | Payer: MEDICAID | Attending: Family Medicine | Admitting: Family Medicine

## 2023-06-02 DIAGNOSIS — R102 Pelvic and perineal pain: Secondary | ICD-10-CM | POA: Diagnosis not present

## 2023-06-02 DIAGNOSIS — N926 Irregular menstruation, unspecified: Secondary | ICD-10-CM | POA: Insufficient documentation

## 2023-06-02 DIAGNOSIS — Z202 Contact with and (suspected) exposure to infections with a predominantly sexual mode of transmission: Secondary | ICD-10-CM | POA: Diagnosis present

## 2023-06-02 LAB — POCT URINALYSIS DIP (MANUAL ENTRY)
Bilirubin, UA: NEGATIVE
Blood, UA: NEGATIVE
Glucose, UA: NEGATIVE mg/dL
Leukocytes, UA: NEGATIVE
Nitrite, UA: NEGATIVE
Protein Ur, POC: 30 mg/dL — AB
Spec Grav, UA: 1.015
Urobilinogen, UA: 1 U/dL
pH, UA: 8.5 — AB

## 2023-06-02 LAB — POCT URINE PREGNANCY: Preg Test, Ur: NEGATIVE

## 2023-06-02 LAB — HIV ANTIBODY (ROUTINE TESTING W REFLEX): HIV Screen 4th Generation wRfx: NONREACTIVE

## 2023-06-02 MED ORDER — DOXYCYCLINE HYCLATE 100 MG PO CAPS: 100.0000 mg | ORAL_CAPSULE | Freq: Two times a day (BID) | ORAL | 0 refills | Status: AC

## 2023-06-02 MED ORDER — CEFTRIAXONE SODIUM 500 MG IJ SOLR
500.0000 mg | INTRAMUSCULAR | Status: DC
  Administered 2023-06-02: 500 mg via INTRAMUSCULAR

## 2023-06-02 MED ORDER — LIDOCAINE HCL (PF) 1 % IJ SOLN
INTRAMUSCULAR | Status: AC
Start: 2023-06-02 — End: ?
  Filled 2023-06-02: qty 2

## 2023-06-02 MED ORDER — CEFTRIAXONE SODIUM 500 MG IJ SOLR
INTRAMUSCULAR | Status: AC
  Filled 2023-06-02: qty 500

## 2023-06-02 NOTE — ED Triage Notes (Signed)
 Complains of abdominal pain for one week.   Came on 1/19-23 Period started 2/23 - 2/27  Next period 3/ 6 -3/10 started bleeding and left lower abdominal pain.   Now has brown vaginal discharge 3/10 and continues now  No burning with urination.  No concerns for urinary symptoms.    Patient has a new sexual partner
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
## 2023-06-02 NOTE — Discharge Instructions (Signed)
 The urine pregnancy test was negative.  There was some protein on your urinalysis, but no white blood cells or red blood cells.  Urine culture is sent  Staff will notify you if there is anything positive on the swab, urine culture, or the blood work.  You have been given a shot of ceftriaxone 500 mg  Take doxycycline 100 mg --1 capsule 2 times daily for 7 days

## 2023-06-03 LAB — CERVICOVAGINAL ANCILLARY ONLY
Bacterial Vaginitis (gardnerella): POSITIVE — AB
Candida Glabrata: NEGATIVE
Candida Vaginitis: NEGATIVE
Chlamydia: NEGATIVE
Comment: NEGATIVE
Comment: NEGATIVE
Comment: NEGATIVE
Comment: NEGATIVE
Comment: NEGATIVE
Comment: NORMAL
Neisseria Gonorrhea: NEGATIVE
Trichomonas: NEGATIVE

## 2023-06-03 LAB — URINE CULTURE

## 2023-06-03 LAB — RPR: RPR Ser Ql: NONREACTIVE

## 2023-06-04 ENCOUNTER — Telehealth (HOSPITAL_COMMUNITY): Payer: Self-pay

## 2023-06-04 MED ORDER — METRONIDAZOLE 500 MG PO TABS: 500.0000 mg | ORAL_TABLET | Freq: Two times a day (BID) | ORAL | 0 refills | Status: AC

## 2023-06-04 NOTE — Telephone Encounter (Signed)
 Per protocol, pt requires tx with metronidazole. Rx sent to pharmacy on file.

## 2023-09-17 ENCOUNTER — Other Ambulatory Visit: Payer: Self-pay

## 2023-09-17 ENCOUNTER — Ambulatory Visit
Admission: EM | Admit: 2023-09-17 | Discharge: 2023-09-17 | Disposition: A | Discharge status: Home / Self Care | Payer: MEDICAID | Attending: Internal Medicine | Admitting: Internal Medicine

## 2023-09-17 DIAGNOSIS — N898 Other specified noninflammatory disorders of vagina: Secondary | ICD-10-CM | POA: Insufficient documentation
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
## 2023-09-17 NOTE — ED Triage Notes (Signed)
 Patient C/O colorless discharge x 2 days and states she is concerned she has BV.Carol Gonzales Denies urinary symptoms.

## 2023-09-17 NOTE — Discharge Instructions (Addendum)
 Your swab was sent to the lab for further testing.  You will be called with results. You should avoid all sexual activity until you have been notified of all your results and have undergone any necessary treatment.  If you are positive, it is recommended that you inform all sexual partners so they can treat be treated as well before having sex again.

## 2023-09-18 LAB — CERVICOVAGINAL ANCILLARY ONLY
Bacterial Vaginitis (gardnerella): POSITIVE — AB
Candida Glabrata: NEGATIVE
Candida Vaginitis: NEGATIVE
Chlamydia: NEGATIVE
Comment: NEGATIVE
Comment: NEGATIVE
Comment: NEGATIVE
Comment: NEGATIVE
Comment: NEGATIVE
Comment: NORMAL
Neisseria Gonorrhea: NEGATIVE
Trichomonas: NEGATIVE

## 2023-09-19 ENCOUNTER — Ambulatory Visit (HOSPITAL_COMMUNITY): Payer: Self-pay

## 2023-09-19 MED ORDER — METRONIDAZOLE 500 MG PO TABS: 500.0000 mg | ORAL_TABLET | Freq: Two times a day (BID) | ORAL | 0 refills | Status: DC

## 2023-09-22 MED ORDER — METRONIDAZOLE 0.75 % VA GEL: 1.0000 | Freq: Every day | VAGINAL | 0 refills | Status: AC

## 2024-01-12 ENCOUNTER — Ambulatory Visit
Admission: EM | Admit: 2024-01-12 | Discharge: 2024-01-12 | Disposition: A | Discharge status: Home / Self Care | Payer: MEDICAID | Source: Ambulatory Visit | Attending: Internal Medicine | Admitting: Internal Medicine

## 2024-01-12 ENCOUNTER — Other Ambulatory Visit: Payer: Self-pay

## 2024-01-12 DIAGNOSIS — F17210 Nicotine dependence, cigarettes, uncomplicated: Secondary | ICD-10-CM | POA: Insufficient documentation

## 2024-01-12 DIAGNOSIS — N898 Other specified noninflammatory disorders of vagina: Secondary | ICD-10-CM | POA: Insufficient documentation

## 2024-01-12 DIAGNOSIS — Z113 Encounter for screening for infections with a predominantly sexual mode of transmission: Secondary | ICD-10-CM | POA: Diagnosis present

## 2024-01-12 DIAGNOSIS — F1290 Cannabis use, unspecified, uncomplicated: Secondary | ICD-10-CM | POA: Diagnosis not present

## 2024-01-12 NOTE — ED Triage Notes (Signed)
 Patient presents today for STD check.Patient denies symptoms. Patient concerned about an exposure.
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
## 2024-01-12 NOTE — Discharge Instructions (Addendum)
 Your swab and blood work were sent to the lab for further testing.  You will be called with results. You should avoid all sexual activity until you have been notified of all your results and have undergone any necessary treatment.  If you are positive, it is recommended that you inform all sexual partners so they can treat be treated as well before having sex again.

## 2024-01-13 ENCOUNTER — Ambulatory Visit (HOSPITAL_COMMUNITY): Payer: Self-pay

## 2024-01-13 LAB — CERVICOVAGINAL ANCILLARY ONLY
Bacterial Vaginitis (gardnerella): POSITIVE — AB
Candida Glabrata: NEGATIVE
Candida Vaginitis: NEGATIVE
Chlamydia: NEGATIVE
Comment: NEGATIVE
Comment: NEGATIVE
Comment: NEGATIVE
Comment: NEGATIVE
Comment: NEGATIVE
Comment: NORMAL
Neisseria Gonorrhea: NEGATIVE
Trichomonas: NEGATIVE

## 2024-02-19 ENCOUNTER — Other Ambulatory Visit: Payer: Self-pay

## 2024-02-19 ENCOUNTER — Ambulatory Visit
Admission: EM | Admit: 2024-02-19 | Discharge: 2024-02-19 | Disposition: A | Discharge status: Home / Self Care | Payer: MEDICAID | Attending: Family Medicine | Admitting: Family Medicine

## 2024-02-19 DIAGNOSIS — Z113 Encounter for screening for infections with a predominantly sexual mode of transmission: Secondary | ICD-10-CM | POA: Diagnosis not present
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
## 2024-02-19 NOTE — ED Triage Notes (Signed)
 Pt states she had unprotected sex and found a broken blister on the shaft of his penis right after sex. Pt states the blister was bleeding.

## 2024-02-19 NOTE — Discharge Instructions (Signed)
 Clinical contact you with results of the testing done today if positive.  I would encourage your partner to get tested as well.  Follow-up with your PCP as needed

## 2024-02-20 LAB — CERVICOVAGINAL ANCILLARY ONLY
Chlamydia: NEGATIVE
Comment: NEGATIVE
Comment: NEGATIVE
Comment: NORMAL
Neisseria Gonorrhea: NEGATIVE
Trichomonas: NEGATIVE

## 2024-02-21 LAB — SYPHILIS: RPR W/REFLEX TO RPR TITER AND TREPONEMAL ANTIBODIES, TRADITIONAL SCREENING AND DIAGNOSIS ALGORITHM: RPR Ser Ql: NONREACTIVE

## 2024-02-21 LAB — HIV ANTIBODY (ROUTINE TESTING W REFLEX): HIV Screen 4th Generation wRfx: NONREACTIVE

## 2024-03-30 ENCOUNTER — Ambulatory Visit
Admission: EM | Admit: 2024-03-30 | Discharge: 2024-03-30 | Disposition: A | Discharge status: Home / Self Care | Payer: MEDICAID

## 2024-03-30 DIAGNOSIS — Z3202 Encounter for pregnancy test, result negative: Secondary | ICD-10-CM | POA: Insufficient documentation

## 2024-03-30 DIAGNOSIS — N946 Dysmenorrhea, unspecified: Secondary | ICD-10-CM | POA: Insufficient documentation

## 2024-03-30 DIAGNOSIS — Z113 Encounter for screening for infections with a predominantly sexual mode of transmission: Secondary | ICD-10-CM | POA: Insufficient documentation

## 2024-03-30 DIAGNOSIS — Z3200 Encounter for pregnancy test, result unknown: Secondary | ICD-10-CM | POA: Diagnosis present

## 2024-03-30 HISTORY — DX: Dizziness and giddiness: R42

## 2024-03-30 LAB — POCT URINE PREGNANCY: Preg Test, Ur: NEGATIVE

## 2024-03-30 NOTE — Discharge Instructions (Addendum)
 The results of your vaginal swab STD testing today which screens for gonorrhea, chlamydia, and trichomonas will be posted to your MyChart account once it is complete.  This typically takes 1 to 3 days.                    Please abstain from sexual intercourse of any kind, vaginal, oral or anal, until you have received the results of your STD testing.                     If any of your results are abnormal, you will receive a phone call regarding treatment.  Prescriptions, if any are needed, will be provided for you at your pharmacy.                       Your urine pregnancy test today is Negative.  Please follow up with primary care for abnormal periods.                       Please remember that there are only 2 ways to prevent transmission of sexually transmitted disease: to not have sex or to always use condoms when having sex.                        If you have not had complete resolution of your symptoms after completing any recommended treatment or if your symptoms worsen, please return for repeat evaluation.                    Thank you for visiting [facility] today.  We appreciate the opportunity to participate in your care.

## 2024-03-30 NOTE — ED Triage Notes (Signed)
 Patient states she has been on her period for about a week. The period has been longer than normal although she denies the period being heavier than normal.
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
## 2024-04-01 LAB — CERVICOVAGINAL ANCILLARY ONLY
Chlamydia: NEGATIVE
Comment: NEGATIVE
Comment: NEGATIVE
Comment: NORMAL
Neisseria Gonorrhea: NEGATIVE
Trichomonas: NEGATIVE

## 2024-04-03 ENCOUNTER — Ambulatory Visit: Payer: Self-pay | Admitting: Emergency Medicine
